# Patient Record
Sex: Female | Born: 1950
Health system: Southern US, Community
[De-identification: ages and names within clinical notes are randomized; demographics above are authoritative.]

## PROBLEM LIST (undated history)

## (undated) DIAGNOSIS — F329 Major depressive disorder, single episode, unspecified: Secondary | ICD-10-CM

## (undated) DIAGNOSIS — F32A Depression, unspecified: Secondary | ICD-10-CM

## (undated) DIAGNOSIS — E079 Disorder of thyroid, unspecified: Secondary | ICD-10-CM

## (undated) HISTORY — DX: Depression, unspecified: F32.A

## (undated) HISTORY — DX: Disorder of thyroid, unspecified: E07.9

## (undated) HISTORY — PX: CARDIAC ELECTROPHYSIOLOGY MAPPING AND ABLATION: SHX1292

## (undated) HISTORY — PX: CARPAL TUNNEL RELEASE: SHX101

## (undated) HISTORY — DX: Major depressive disorder, single episode, unspecified: F32.9

## (undated) HISTORY — PX: ABDOMINAL HYSTERECTOMY: SHX81

---

## 2002-10-07 ENCOUNTER — Other Ambulatory Visit: Admission: RE | Admit: 2002-10-07 | Discharge: 2002-10-07 | Payer: Self-pay | Admitting: *Deleted

## 2003-02-11 ENCOUNTER — Encounter: Payer: Self-pay | Admitting: Family Medicine

## 2003-02-11 ENCOUNTER — Encounter: Admission: RE | Admit: 2003-02-11 | Discharge: 2003-02-11 | Payer: Self-pay | Admitting: Family Medicine

## 2003-02-15 ENCOUNTER — Ambulatory Visit (HOSPITAL_COMMUNITY): Admission: RE | Admit: 2003-02-15 | Discharge: 2003-02-15 | Payer: Self-pay | Admitting: Internal Medicine

## 2003-06-22 ENCOUNTER — Encounter: Admission: RE | Admit: 2003-06-22 | Discharge: 2003-06-22 | Payer: Self-pay | Admitting: Family Medicine

## 2003-06-22 ENCOUNTER — Encounter: Payer: Self-pay | Admitting: Family Medicine

## 2004-03-30 ENCOUNTER — Other Ambulatory Visit: Admission: RE | Admit: 2004-03-30 | Discharge: 2004-03-30 | Payer: Self-pay | Admitting: Family Medicine

## 2004-05-21 ENCOUNTER — Encounter: Admission: RE | Admit: 2004-05-21 | Discharge: 2004-05-21 | Payer: Self-pay | Admitting: Obstetrics and Gynecology

## 2004-08-03 ENCOUNTER — Other Ambulatory Visit: Admission: RE | Admit: 2004-08-03 | Discharge: 2004-08-03 | Payer: Self-pay | Admitting: Obstetrics and Gynecology

## 2004-11-30 ENCOUNTER — Other Ambulatory Visit: Admission: RE | Admit: 2004-11-30 | Discharge: 2004-11-30 | Payer: Self-pay | Admitting: Obstetrics and Gynecology

## 2005-02-26 ENCOUNTER — Other Ambulatory Visit: Admission: RE | Admit: 2005-02-26 | Discharge: 2005-02-26 | Payer: Self-pay | Admitting: Obstetrics and Gynecology

## 2005-04-02 ENCOUNTER — Ambulatory Visit: Payer: Self-pay | Admitting: Family Medicine

## 2005-06-03 ENCOUNTER — Other Ambulatory Visit: Admission: RE | Admit: 2005-06-03 | Discharge: 2005-06-03 | Payer: Self-pay | Admitting: Obstetrics and Gynecology

## 2005-06-18 ENCOUNTER — Ambulatory Visit: Payer: Self-pay | Admitting: Family Medicine

## 2005-06-19 ENCOUNTER — Encounter: Admission: RE | Admit: 2005-06-19 | Discharge: 2005-06-19 | Payer: Self-pay | Admitting: Obstetrics and Gynecology

## 2006-02-11 ENCOUNTER — Other Ambulatory Visit: Admission: RE | Admit: 2006-02-11 | Discharge: 2006-02-11 | Payer: Self-pay | Admitting: Obstetrics and Gynecology

## 2006-06-18 ENCOUNTER — Other Ambulatory Visit: Admission: RE | Admit: 2006-06-18 | Discharge: 2006-06-18 | Payer: Self-pay | Admitting: Obstetrics and Gynecology

## 2006-08-07 ENCOUNTER — Encounter: Admission: RE | Admit: 2006-08-07 | Discharge: 2006-08-07 | Payer: Self-pay | Admitting: Obstetrics and Gynecology

## 2008-05-26 ENCOUNTER — Ambulatory Visit: Payer: Self-pay | Admitting: Family Medicine

## 2008-05-26 DIAGNOSIS — S59919A Unspecified injury of unspecified forearm, initial encounter: Secondary | ICD-10-CM

## 2008-05-26 DIAGNOSIS — F3289 Other specified depressive episodes: Secondary | ICD-10-CM | POA: Insufficient documentation

## 2008-05-26 DIAGNOSIS — E039 Hypothyroidism, unspecified: Secondary | ICD-10-CM

## 2008-05-26 DIAGNOSIS — F438 Other reactions to severe stress: Secondary | ICD-10-CM

## 2008-05-26 DIAGNOSIS — S6990XA Unspecified injury of unspecified wrist, hand and finger(s), initial encounter: Secondary | ICD-10-CM

## 2008-05-26 DIAGNOSIS — F329 Major depressive disorder, single episode, unspecified: Secondary | ICD-10-CM

## 2008-05-26 DIAGNOSIS — S59909A Unspecified injury of unspecified elbow, initial encounter: Secondary | ICD-10-CM

## 2008-05-26 DIAGNOSIS — F4389 Other reactions to severe stress: Secondary | ICD-10-CM | POA: Insufficient documentation

## 2008-05-26 HISTORY — DX: Hypothyroidism, unspecified: E03.9

## 2008-06-08 LAB — CONVERTED CEMR LAB
ALT: 34 units/L (ref 0–35)
AST: 36 units/L (ref 0–37)
Albumin: 4.4 g/dL (ref 3.5–5.2)
Alkaline Phosphatase: 40 units/L (ref 39–117)
BUN: 25 mg/dL — ABNORMAL HIGH (ref 6–23)
Basophils Absolute: 0.1 10*3/uL (ref 0.0–0.1)
Basophils Relative: 0.8 % (ref 0.0–3.0)
Bilirubin, Direct: 0.1 mg/dL (ref 0.0–0.3)
CO2: 29 meq/L (ref 19–32)
Calcium: 9.2 mg/dL (ref 8.4–10.5)
Chloride: 102 meq/L (ref 96–112)
Cholesterol: 232 mg/dL (ref 0–200)
Creatinine, Ser: 1.1 mg/dL (ref 0.4–1.2)
Direct LDL: 166.7 mg/dL
Eosinophils Absolute: 0.1 10*3/uL (ref 0.0–0.7)
Eosinophils Relative: 1.5 % (ref 0.0–5.0)
GFR calc Af Amer: 66 mL/min
GFR calc non Af Amer: 54 mL/min
Glucose, Bld: 94 mg/dL (ref 70–99)
HCT: 41.4 % (ref 36.0–46.0)
HDL: 59.2 mg/dL (ref >39.0)
Hemoglobin: 14.1 g/dL (ref 12.0–15.0)
Lymphocytes Relative: 32.3 % (ref 12.0–46.0)
MCHC: 34.1 g/dL (ref 30.0–36.0)
MCV: 100.5 fL — ABNORMAL HIGH (ref 78.0–100.0)
Monocytes Absolute: 0.6 10*3/uL (ref 0.1–1.0)
Monocytes Relative: 6.8 % (ref 3.0–12.0)
Neutro Abs: 5 10*3/uL (ref 1.4–7.7)
Neutrophils Relative %: 58.6 % (ref 43.0–77.0)
Platelets: 243 10*3/uL (ref 150–400)
Potassium: 4.5 meq/L (ref 3.5–5.1)
RBC: 4.12 M/uL (ref 3.87–5.11)
RDW: 12.4 % (ref 11.5–14.6)
Sodium: 140 meq/L (ref 135–145)
TSH: 43.19 microintl units/mL — ABNORMAL HIGH (ref 0.35–5.50)
Total Bilirubin: 1.1 mg/dL (ref 0.3–1.2)
Total CHOL/HDL Ratio: 3.9
Total Protein: 7.1 g/dL (ref 6.0–8.3)
Triglycerides: 91 mg/dL (ref 0–149)
VLDL: 18 mg/dL (ref 0–40)
WBC: 8.5 10*3/uL (ref 4.5–10.5)

## 2008-06-09 ENCOUNTER — Encounter (INDEPENDENT_AMBULATORY_CARE_PROVIDER_SITE_OTHER): Payer: Self-pay | Admitting: *Deleted

## 2008-11-09 ENCOUNTER — Ambulatory Visit: Payer: Self-pay | Admitting: Family Medicine

## 2008-11-27 LAB — CONVERTED CEMR LAB
ALT: 18 units/L (ref 0–35)
AST: 19 units/L (ref 0–37)
Albumin: 3.8 g/dL (ref 3.5–5.2)
Alkaline Phosphatase: 41 units/L (ref 39–117)
Bilirubin, Direct: 0.1 mg/dL (ref 0.0–0.3)
Cholesterol: 166 mg/dL (ref 0–200)
HDL: 42.2 mg/dL (ref >39.0)
LDL Cholesterol: 111 mg/dL — ABNORMAL HIGH (ref 0–99)
Total Bilirubin: 1.4 mg/dL — ABNORMAL HIGH (ref 0.3–1.2)
Total CHOL/HDL Ratio: 3.9
Total Protein: 6.4 g/dL (ref 6.0–8.3)
Triglycerides: 63 mg/dL (ref 0–149)
VLDL: 13 mg/dL (ref 0–40)

## 2008-11-29 ENCOUNTER — Encounter (INDEPENDENT_AMBULATORY_CARE_PROVIDER_SITE_OTHER): Payer: Self-pay | Admitting: *Deleted

## 2009-01-16 ENCOUNTER — Encounter: Payer: Self-pay | Admitting: Family Medicine

## 2009-01-23 ENCOUNTER — Telehealth (INDEPENDENT_AMBULATORY_CARE_PROVIDER_SITE_OTHER): Payer: Self-pay | Admitting: *Deleted

## 2009-01-26 ENCOUNTER — Ambulatory Visit: Payer: Self-pay | Admitting: Family Medicine

## 2009-02-13 ENCOUNTER — Telehealth (INDEPENDENT_AMBULATORY_CARE_PROVIDER_SITE_OTHER): Payer: Self-pay | Admitting: *Deleted

## 2009-08-07 ENCOUNTER — Encounter (INDEPENDENT_AMBULATORY_CARE_PROVIDER_SITE_OTHER): Payer: Self-pay | Admitting: *Deleted

## 2009-09-04 ENCOUNTER — Telehealth (INDEPENDENT_AMBULATORY_CARE_PROVIDER_SITE_OTHER): Payer: Self-pay | Admitting: *Deleted

## 2009-10-06 ENCOUNTER — Ambulatory Visit: Payer: Self-pay | Admitting: Family Medicine

## 2010-01-05 ENCOUNTER — Ambulatory Visit: Payer: Self-pay | Admitting: Family Medicine

## 2010-01-08 ENCOUNTER — Encounter (INDEPENDENT_AMBULATORY_CARE_PROVIDER_SITE_OTHER): Payer: Self-pay | Admitting: *Deleted

## 2010-02-07 ENCOUNTER — Ambulatory Visit: Payer: Self-pay | Admitting: Family Medicine

## 2010-02-21 ENCOUNTER — Telehealth (INDEPENDENT_AMBULATORY_CARE_PROVIDER_SITE_OTHER): Payer: Self-pay | Admitting: *Deleted

## 2010-09-30 LAB — CONVERTED CEMR LAB: Pap Smear: NORMAL

## 2010-10-02 ENCOUNTER — Encounter (INDEPENDENT_AMBULATORY_CARE_PROVIDER_SITE_OTHER): Payer: Self-pay | Admitting: *Deleted

## 2010-10-02 ENCOUNTER — Telehealth (INDEPENDENT_AMBULATORY_CARE_PROVIDER_SITE_OTHER): Payer: Self-pay | Admitting: *Deleted

## 2010-10-02 NOTE — Progress Notes (Signed)
Summary: refill-lowne  Phone Note Refill Request Message from:  Patient  Refills Requested: Medication #1:  SYNTHROID 125 MCG TABS 1 by mouth once daily**LABS DUE NOW** pt left VM that she is out of her med  and needs refill but is not sure if she needs labs done prior to refill. left message to call  back to verify pharmacy and labs are due 244.9  TSH  Initial call taken by: [facility] CMA,  September 04, 2009 12:08 PM  Follow-up for Phone Call        pt called back and verified pharm, left message. I called pt and left message for her to call back I sent the rx to pharm. She just needs to schedule labs.  Follow-up by: Army Fossa CMA,  September 04, 2009 1:22 PM    Prescriptions: SYNTHROID 125 MCG TABS (LEVOTHYROXINE SODIUM) 1 by mouth once daily**LABS DUE NOW**  #30 x 0   Entered by:   Army Fossa CMA   Authorized by:   Loreen Freud DO   Signed by:   Army Fossa CMA on 09/04/2009   Method used:   Electronically to        [facility]. [phone]* (retail)       [address]       [place], [place]  [address]       Ph: [phone]       Fax: [phone]   RxID:   [id number]

## 2010-10-02 NOTE — Letter (Signed)
Summary: Previsit letter  [facility]  [address] [place], [place] [address]   Phone: [phone]  Fax: [phone]       01/08/2010 MRN: [id number]  [facility] [place] [address] [place], [place]  [address]  Dear Megan Romero,  Welcome to the Gastroenterology Division at [facility].    You are scheduled to see a nurse for your pre-procedure visit on 01-17-10 at 4:00p.m. on the 3rd floor at [facility], [address]  We ask that you try to arrive at our office 15 minutes prior to your appointment time to allow for check-in.  Your nurse visit will consist of discussing your medical and surgical history, your immediate family medical history, and your medications.    Please bring a complete list of all your medications or, if you prefer, bring the medication bottles and we will list them.  We will need to be aware of both prescribed and over the counter drugs.  We will need to know exact dosage information as well.  If you are on blood thinners (Coumadin, Plavix, Aggrenox, Ticlid, etc.) please call our office today/prior to your appointment, as we need to consult with your physician about holding your medication.   Please be prepared to read and sign documents such as consent forms, a financial agreement, and acknowledgement forms.  If necessary, and with your consent, a friend or relative is welcome to sit-in on the nurse visit with you.  Please bring your insurance card so that we may make a copy of it.  If your insurance requires a referral to see a specialist, please bring your referral form from your primary care physician.  No co-pay is required for this nurse visit.     If you cannot keep your appointment, please call [phone] to cancel or reschedule prior to your appointment date.  This allows [place] the opportunity to schedule an appointment for another patient in need of care.    Thank you for choosing [facility] for your medical  needs.  We appreciate the opportunity to care for you.  Please visit [place] at our website  to learn more about our practice.                     Sincerely.                                                                                                                   The Gastroenterology Division

## 2010-10-02 NOTE — Progress Notes (Signed)
Summary: alternate med  Phone Note Call from Patient   Caller: Patient Summary of Call: Pt called stating she was taking Celexa 1/2 dose and everday she was having HA's and she states that it mader her very aggressive. She would like to know if there is an alternate medication she can take. Army Fossa CMA  February 21, 2010 2:33 PM   Follow-up for Phone Call        prozac 20 mg #30 1 by mouth once daily , 2 refills f/u 4-6 weeks Follow-up by: Loreen Freud DO,  February 21, 2010 3:47 PM  Additional Follow-up for Phone Call Additional follow up Details #1::        left message for pt to call back. Need to give her instructions on new med and need to find out which pharmacy pt uses. Army Fossa CMA  February 21, 2010 3:50 PM     Additional Follow-up for Phone Call Additional follow up Details #2::    left message for pt to call back. Army Fossa CMA  February 23, 2010 9:28 AM   Additional Follow-up for Phone Call Additional follow up Details #3:: Details for Additional Follow-up Action Taken: Pt is aware. Army Fossa CMA  February 23, 2010 2:22 PM   New/Updated Medications: PROZAC 20 MG CAPS (FLUOXETINE HCL) 1 by mouth once daily. Follow up in 4-6 weeks. Prescriptions: PROZAC 20 MG CAPS (FLUOXETINE HCL) 1 by mouth once daily. Follow up in 4-6 weeks.  #30 x 2   Entered by:   Army Fossa CMA   Authorized by:   Loreen Freud DO   Signed by:   Army Fossa CMA on 02/23/2010   Method used:   Electronically to        [facility]. [phone]* (retail)       [address]       [place], [place]  [address]       Ph: [phone]       Fax: [phone]   RxID:   [id number]

## 2010-10-02 NOTE — Assessment & Plan Note (Signed)
Summary: FOR CPX AND MENOPAUSE PROBLEM--PH   Vital Signs:  Patient profile:   60 year old female Height:      68 inches Weight:      149 pounds BMI:     22.74 Pulse rate:   82 / minute Pulse rhythm:   regular BP sitting:   106 / 76  (left arm) Cuff size:   regular  Vitals Entered By: Army Fossa CMA (Jan 05, 2010 2:14 PM) CC: Pt here for CPX, no pap. , Depressive symptoms   History of Present Illness: Pt here for cpe. Pt still struggling with anxiety and grief from huband dying 16 days after being married.  There are also a lot of changes at work.    Depressive symptoms      This is a 60 year old woman who presents with Depressive symptoms.  The patient reports depressed mood and insomnia, but denies loss of interest/pleasure, significant weight loss, significant weight gain, hypersomnia, psychomotor agitation, and psychomotor retardation.  The patient also reports fatigue or loss of energy and indecisiveness.  The patient denies feelings of worthlessness, diminished concentration, thoughts of death, thoughts of suicide, suicidal intent, and suicidal plans.  The patient reports the following psychosocial stressors: recent death of a loved one and major life changes.  The patient denies abnormally elevated mood, abnormally irritable mood, decreased need for sleep, increased talkativeness, distractibility, flight of ideas, increased goal-directed activity, and inflated self-esteem/ grandiosity.    Preventive Screening-Counseling & Management  Alcohol-Tobacco     Alcohol drinks/day: 2     Smoking Status: current  Caffeine-Diet-Exercise     Caffeine use/day: 2     Does Patient Exercise: yes  Hep-HIV-STD-Contraception     HIV Risk: no     Dental Visit-last 6 months yes     Dental Care Counseling: not indicated; dental care within six months     Sun Exposure-Excessive: frequently  Safety-Violence-Falls     Seat Belt Use: 100      Drug Use:  no.    Current Medications  (verified): 1)  Aspirin 81 Mg Tbec (Aspirin) .... Take 1 Tab Once Daily 2)  Synthroid 125 Mcg Tabs (Levothyroxine Sodium) .Marland Kitchen.. 1 By Mouth Once Daily 3)  Celexa 20 Mg Tabs (Citalopram Hydrobromide) .Marland Kitchen.. 1 By Mouth Once Daily  Allergies: 1)  ! Pcn  Past History:  Past Medical History: Last updated: 05/26/2008 Hypothyroidism Depression  Past Surgical History: Last updated: 05/26/2008 Hysterectomy Current Problems:  WRIST INJURY (ICD-959.3) * CARDIAC ABLATION  Family History: Last updated: 05/26/2008 none  Social History: Last updated: 05/26/2008 Widow/Widower Current Smoker Drug use-no Regular exercise-yes Alcohol use-yes  Risk Factors: Alcohol Use: 2 (01/05/2010) Caffeine Use: 2 (01/05/2010) Exercise: yes (01/05/2010)  Risk Factors: Smoking Status: current (01/05/2010)  Family History: Reviewed history from 05/26/2008 and no changes required. none  Social History: Reviewed history from 05/26/2008 and no changes required. Widow/Widower Current Smoker Drug use-no Regular exercise-yes Alcohol use-yes Dental Care w/in 6 mos.:  yes  Review of Systems      See HPI General:  Denies chills, fatigue, fever, loss of appetite, malaise, sleep disorder, sweats, weakness, and weight loss. Eyes:  Denies blurring, discharge, double vision, eye irritation, eye pain, halos, itching, light sensitivity, red eye, vision loss-1 eye, and vision loss-both eyes; optho q1y. ENT:  Denies decreased hearing, difficulty swallowing, ear discharge, earache, hoarseness, nasal congestion, nosebleeds, postnasal drainage, ringing in ears, sinus pressure, and sore throat. CV:  Denies bluish discoloration of lips or nails,  chest pain or discomfort, difficulty breathing at night, difficulty breathing while lying down, fainting, fatigue, leg cramps with exertion, lightheadness, near fainting, palpitations, shortness of breath with exertion, swelling of feet, swelling of hands, and weight  gain. Resp:  Denies chest discomfort, chest pain with inspiration, cough, coughing up blood, excessive snoring, hypersomnolence, morning headaches, pleuritic, shortness of breath, sputum productive, and wheezing. GI:  Denies abdominal pain, bloody stools, change in bowel habits, constipation, dark tarry stools, diarrhea, excessive appetite, gas, hemorrhoids, indigestion, loss of appetite, nausea, vomiting, vomiting blood, and yellowish skin color. GU:  Denies abnormal vaginal bleeding, decreased libido, discharge, dysuria, genital sores, hematuria, incontinence, nocturia, urinary frequency, and urinary hesitancy. MS:  Denies joint pain, joint redness, joint swelling, loss of strength, low back pain, mid back pain, muscle aches, muscle , cramps, muscle weakness, stiffness, and thoracic pain. Derm:  Denies changes in color of skin, changes in nail beds, dryness, excessive perspiration, flushing, hair loss, insect bite(s), itching, lesion(s), poor wound healing, and rash. Neuro:  Denies brief paralysis, difficulty with concentration, disturbances in coordination, falling down, headaches, inability to speak, memory loss, numbness, poor balance, seizures, sensation of room spinning, tingling, tremors, visual disturbances, and weakness. Psych:  Complains of depression; denies alternate hallucination ( auditory/visual), anxiety, easily angered, easily tearful, irritability, mental problems, panic attacks, sense of great danger, suicidal thoughts/plans, thoughts of violence, unusual visions or sounds, and thoughts /plans of harming others. Endo:  Denies cold intolerance, excessive hunger, excessive thirst, excessive urination, heat intolerance, polyuria, and weight change. Heme:  Denies abnormal bruising, bleeding, enlarge lymph nodes, fevers, pallor, and skin discoloration. Allergy:  Denies hives or rash, itching eyes, persistent infections, seasonal allergies, and sneezing.  Physical Exam  General:   Well-developed,well-nourished,in no acute distress; alert,appropriate and cooperative throughout examination Head:  Normocephalic and atraumatic without obvious abnormalities. No apparent alopecia or balding. Eyes:  pupils equal, pupils round, pupils reactive to light, and no injection.   Ears:  External ear exam shows no significant lesions or deformities.  Otoscopic examination reveals clear canals, tympanic membranes are intact bilaterally without bulging, retraction, inflammation or discharge. Hearing is grossly normal bilaterally. Nose:  External nasal examination shows no deformity or inflammation. Nasal mucosa are pink and moist without lesions or exudates. Mouth:  Oral mucosa and oropharynx without lesions or exudates.  Teeth in good repair. Neck:  No deformities, masses, or tenderness noted. Chest Wall:  No deformities, masses, or tenderness noted. Breasts:  No mass, nodules, thickening, tenderness, bulging, retraction, inflamation, nipple discharge or skin changes noted.   Lungs:  Normal respiratory effort, chest expands symmetrically. Lungs are clear to auscultation, no crackles or wheezes. Heart:  normal rate and no murmur.   Abdomen:  Bowel sounds positive,abdomen soft and non-tender without masses, organomegaly or hernias noted. Genitalia:  deferred Msk:  normal ROM, no joint tenderness, no joint swelling, no joint warmth, no redness over joints, no joint deformities, no joint instability, and no crepitation.   Pulses:  R posterior tibial normal, R dorsalis pedis normal, R carotid normal, L posterior tibial normal, L dorsalis pedis normal, and L carotid normal.   Extremities:  No clubbing, cyanosis, edema, or deformity noted with normal full range of motion of all joints.   Neurologic:  No cranial nerve deficits noted. Station and gait are normal. Plantar reflexes are down-going bilaterally. DTRs are symmetrical throughout. Sensory, motor and coordinative functions appear intact. Skin:   Intact without suspicious lesions or rashes Cervical Nodes:  No lymphadenopathy noted Psych:  Oriented  X3, normally interactive, good eye contact, not anxious appearing, and not depressed appearing.     Impression & Recommendations:  Problem # 1:  PREVENTIVE HEALTH CARE (ICD-V70.0)  Orders: EKG w/ Interpretation ([address]) Gastroenterology Referral (GI)  Problem # 2:  OTHER ACUTE REACTIONS TO STRESS (ICD-308.3) celexa 20 mg once daily   Problem # 3:  HYPOTHYROIDISM (ICD-244.9)  Her updated medication list for this problem includes:    Synthroid 125 Mcg Tabs (Levothyroxine sodium) .Marland Kitchen... 1 by mouth once daily  Orders: EKG w/ Interpretation ([address])  Labs Reviewed: TSH: 2.26 (10/06/2009)    Chol: 166 (11/09/2008)   HDL: 42.2 (11/09/2008)   LDL: 111 (11/09/2008)   TG: 63 (11/09/2008)  Complete Medication List: 1)  Aspirin 81 Mg Tbec (Aspirin) .... Take 1 tab once daily 2)  Synthroid 125 Mcg Tabs (Levothyroxine sodium) .Marland Kitchen.. 1 by mouth once daily 3)  Celexa 20 Mg Tabs (Citalopram hydrobromide) .Marland Kitchen.. 1 by mouth once daily  Patient Instructions: 1)  Start Celexa 20 mg 1/2 by mouth daily for 8 days then increase to 1 pill daily 2)  Please schedule a follow-up appointment in 1 month---ov and labs Prescriptions: CELEXA 20 MG TABS (CITALOPRAM HYDROBROMIDE) 1 by mouth once daily  #30 x 5   Entered and Authorized by:   Loreen Freud DO   Signed by:   Loreen Freud DO on 01/05/2010   Method used:   Electronically to        [facility]. [phone]* (retail)       [address]       [place], [place]  [address]       Ph: [phone]       Fax: [phone]   RxID:   [id number]    EKG  Procedure date:  01/05/2010  Findings:      Normal sinus rhythm with rate of: 84 bpm   Right axis deviation.     PAP Result Date:  01/14/2007 PAP Result:  normal PAP Next Due:  3 yr      Immunization History:  Influenza Immunization History:    Influenza:   Fluvax 3+ (06/14/2009)  Tetanus/Td Immunization History:    Tetanus/Td:  Historical (03/18/2001)

## 2010-10-10 NOTE — Progress Notes (Signed)
Summary: RX  Phone Note Refill Request Call back at Home Phone [phone] Message from:  Patient on October 02, 2010 1:34 PM  Refills Requested: Medication #1:  SYNTHROID 125 MCG TABS 1 by mouth once daily   Dosage confirmed as above?Dosage Confirmed   Supply Requested: 3 months PLEASE SENT TO MAIL ORDER--[facility] ---WANT TO KNOW IF IT IS TIME TO GET HER THYROID CHECK PLEASE CALL AND LET HER KNOW.  Initial call taken by: Freddy Jaksch,  October 02, 2010 1:35 PM    Prescriptions: SYNTHROID 125 MCG TABS (LEVOTHYROXINE SODIUM) 1 by mouth once daily  #90 Tablet x 0   Entered by:   Almeta Monas CMA (AAMA)   Authorized by:   Loreen Freud DO   Signed by:   Almeta Monas CMA (AAMA) on 10/02/2010   Method used:   Faxed to ...       [facility] (mail-order)             , [place]         Ph: [phone]       Fax: [phone]   RxID:   [id number]

## 2010-10-10 NOTE — Letter (Signed)
Summary: Primary Care Appointment Letter  [facility]  [address] [place], [place] [address]   Phone: [phone]  Fax: [phone]    10/02/2010 MRN: [id number]     [facility] [address] [place], [place]  [address]     Dear Ms. Lovick,   Your Primary Care Physician Loreen Freud DO has indicated that:    ___X____it is time to schedule an appointment for Labs.    _______you missed your appointment on______ and need to call and          reschedule.    _______you need to have lab work done.    _______you need to schedule an appointment discuss lab or test results.    _______you need to call to reschedule your appointment that is                       scheduled on _________.     Please call our office as soon as possible. Our phone number is [phone]. Please press option 1. Our office is open 8a-5p, Monday through Friday.     Thank you,    [place] [facility] Scheduler

## 2010-12-27 ENCOUNTER — Telehealth: Payer: Self-pay | Admitting: Family Medicine

## 2010-12-27 NOTE — Telephone Encounter (Signed)
The only meds she takes Synthroid so TSH is all she needs. CPX due in May      KP

## 2010-12-27 NOTE — Telephone Encounter (Signed)
Patient received letter [phone] requesting that she make a lab appt - patient has lab scheduled [phone] - does she need other labs or just tsh - need lab order with dx

## 2010-12-27 NOTE — Telephone Encounter (Signed)
Pt needs fasting appt for cpx w/ Dr. And will do labs after seeing Dr. Rock Nephew past do for everything.

## 2010-12-28 NOTE — Telephone Encounter (Signed)
Lab on schedule per kim dx 244.9

## 2011-01-07 ENCOUNTER — Other Ambulatory Visit (INDEPENDENT_AMBULATORY_CARE_PROVIDER_SITE_OTHER): Payer: 59

## 2011-01-07 DIAGNOSIS — E039 Hypothyroidism, unspecified: Secondary | ICD-10-CM

## 2011-01-07 LAB — TSH: TSH: 1.11 u[IU]/mL (ref 0.35–5.50)

## 2011-01-10 ENCOUNTER — Telehealth: Payer: Self-pay | Admitting: Family Medicine

## 2011-01-10 MED ORDER — LEVOTHYROXINE SODIUM 125 MCG PO TABS: 125.0000 ug | ORAL_TABLET | Freq: Every day | ORAL | Status: DC

## 2011-01-10 NOTE — Telephone Encounter (Signed)
Faxed.   KP 

## 2011-01-10 NOTE — Telephone Encounter (Signed)
Patient needs new rx for synthroid - [facility] mail order

## 2011-01-18 NOTE — Discharge Summary (Signed)
   Megan Romero, Megan Romero                         ACCOUNT NO.:  [id number]   MEDICAL RECORD NO.:  [id number]                   PATIENT TYPE:  OIB   LOCATION:  2034                                 FACILITY:  [facility]   PHYSICIAN:  Duke Salvia, M.D.               DATE OF BIRTH:  1951/01/30   DATE OF ADMISSION:  02/15/2003  DATE OF DISCHARGE:  02/15/2003                                 DISCHARGE SUMMARY   PRIMARY DIAGNOSIS:  Atrial supraventricular tachycardia.   SECONDARY DIAGNOSIS:  None.   HISTORY OF PRESENT ILLNESS:  This is a 60 year old female with a history of  recent abrupt onset tachycardia associated with modest symptoms, typically  recent duration. The patient was given an event recorder, which demonstrated  supraventricular tachycardia. A 2-D echo was reportedly normal. Normal  stress test.   PAST MEDICAL HISTORY:  Hyperthyroidism status post hysterectomy.   HOSPITAL COURSE:  The patient was admitted and underwent a successful  ablation of her right atrial tachycardia spontaneously and easily induced  atrial flutters, atypical ablations. No precursory pathway. The patient was  observed for eight hours. She did run hypotensive postoperatively and had a  250 cc normal saline bolus with increase of heart rate to the 80s, 82/40  range. She remained in normal sinus rhythm. She was discharged in stable  condition on Effexor 75 mg daily, Synthroid 1.25 mg daily, and Ministat  daily. She was to stop her Cardizem, coated aspirin daily for six weeks.  Antibiotics prophylaxis for the next three months. Tylenol 1-2 tablets every  4-6 hours as needed. No heavy lifting or strenuous activities for four days.  No driving.   Low-fat, low-cholesterol diet. She was to call if she developed a lump or  any drainage in her groin. An appointment was scheduled with Dr. Graciela Husbands for  August 18 at 3:30 p.m.     Chinita Pester, C.R.N.P. [facility]                 Duke Salvia, M.D.    DS/MEDQ  D:   02/15/2003  T:  02/15/2003  Job:  [id number]   cc:   Duke Salvia, M.D.   Learta Codding, M.D.  [address]  [address]  [place]  [place] [address]    cc:   Duke Salvia, M.D.   Learta Codding, M.D.  [address]  [address]  [place]  [place] [address]

## 2011-01-18 NOTE — Op Note (Signed)
NAMEJAZLYNE, Megan Romero                         ACCOUNT NO.:  [id number]   MEDICAL RECORD NO.:  [id number]                   PATIENT TYPE:  OIB   LOCATION:  2034                                 FACILITY:  [facility]   PHYSICIAN:  Duke Salvia, M.D.               DATE OF BIRTH:  02/13/1951   DATE OF PROCEDURE:  02/15/2003  DATE OF DISCHARGE:                                 OPERATIVE REPORT   PREOPERATIVE DIAGNOSIS:  Supraventricular tachycardia.   POSTOPERATIVE DIAGNOSES:  Right atrial tachycardia; atrial flutter-  atypical/atrial fibrillation.   PROCEDURE PERFORMED:  Invasive electrophysiologic study, isoproterenol  infusion, arrhythmia mapping, radio frequency catheter ablation.   DESCRIPTION OF PROCEDURE:  Following obtaining informed consent, the patient  was brought to the electrophysiology laboratory and placed on the  fluoroscopic table in supine position.  After routine prep and drape,  cardiac catheterization was performed with local anesthesia and conscious  sedation.  Noninvasive blood pressure monitoring, transcutaneous oxygen  saturation monitoring, end tidal CO2 monitoring were performed continuously  throughout the procedure.  Following the procedure, the catheter was  removed.  Hemostasis was obtained.  The patient was transferred to the floor  in stable condition.   CATHETERS:  A 5 French quadripolar catheter was inserted via the left  femoral vein to the high right atrium.  A 5 French quadripolar catheter was  inserted via the left femoral vein to the right ventricular apex. A 5 French  quadripolar catheter was inserted via the left femoral vein to the  AV  junction.  A 6 French octapolar catheter was inserted via the right femoral  vein to the coronary sinus.  A 7 French 5 mm deflectable tip catheter was  inserted via the right femoral vein to mapping sites in the posterior septal  space.   TECHNIQUE:  Surface leads I, aVF, and V1 were monitored continuously  throughout the procedure.  Following insertion of the catheters, the  stimulation protocol included incremental atrial pacing, incremental  ventricular pacing, single atrial extrastimuli at a paced cycle length of  600, 500 and 400 msec.  Single ventricular extrastimuli at a paced cycle  length of 600 and 500 msec.  Repeated in the presence of isoproterenol.   RESULTS:  Surface electrocardiogram at triphasic intervals.   INITIAL:   FINAL:  Rhythm sinus initially                  Sinus final  Cycle length is 950 msec initial          524 msec final  Cycle length is 149 msec initial          120 msec final  QRS duration is 120 msec initial    9 msec final  QT interval is 508 msec initial           361 msec final  P-wave duration is 104 msec initial 106 msec final  Bundle branch block is absent             absent on final  Pre-excitation is absent                  absent on final   AH INTERVAL:  AH interval is 81 msec initial          62 msec final  HA interval is 47 msec initial            60 msec final  His bundle duration is 14 msec initial    17 msec final   AV NODAL FUNCTION:  AV Wenckebach was less than 500 msec in the presence of  isoproterenol and was about 500 msec in the absence of it.  VA Wenckebach  was also 500 msec in the absence of isoproterenol and in the presence of  isoproterenol was 380 msec.  AV nodal conduction was continuous pre and  postablation.   ACCESSORY PATHWAY FUNCTION:  No evidence of an accessory pathway was  identified.   ARRHYTHMIAS INDUCED:  1. Atrial tachycardia was reproducibly inducible to ventricular programmed     stimulation.  This was not seen with atrial stimulation.  Double     ventricular extrastimuli at 450:250:300-290 in the presence of     isoproterenol reproducibly induced with tachycardia with earliest atrial     activation initially at the os of the coronary sinus.  See below.  2. Atypical atrial flutters were also induced with left  and right atrial     discordance and lateral and septal right atrial discordance with atrial     fibrillation noted on the right side and atrial flutter notably passive     activation noted on the left side.  3. These were induced in the presence and in the absence of isoproterenol     with single atrial extrastimuli as well as atrial burst pacing at     .   CHARACTERISTICS OF ATRIAL TACHYCARDIA:  1. Earliest onset was in the low right atrium at the distal ramification of     the crista terminalis just posterior to the os of this coronary sinus.  2. It was initiated with ventricular pacing.  3. When it would terminate, it would terminate with an AV.  4. One episode of AV block was noted during tachycardia.   RADIOFREQUENCY ENERGY:  A total of 122 seconds of radio frequency energy was  applied in four applications.  The initial application accelerated the  tachycardia and then terminated it.  The three applications were placed in a  consolidation rosette around the site of the initial burn.   FLUOROSCOPY TIME:  A total of 15 minutes of fluoroscopy time was utilized at  15 frames per second.   IMPRESSION:  1. Normal sinus function.  2. Abnormal atrial function manifested by:     A. A low right atrial tachycardia  at the terminal ramification of the        crista terminalis.  This was successfully ablated.     B. Atrial fibrillation and atrial flutter with right side appearing to        dry up the left side with right atrial/left atrial discordance.     C. Other nonsustained atrial tachycardias were seen with earliest onset        in the left atrium.  3. Normal atrioventricular (AV) nodal function.  4. No accessory pathway.  5. Normal ventricular response to programmed stimulation.  6. Normal  His Purkinje system function.   SUMMARY:  In conclusion, the results of electrophysiologic testing identified a dominant right atrial tachycardia that like the patient's  clinical  tachycardia was initiated with ventricular premature beats.  This  was mapped to the low right atrium just posterior to the coronary sinus os  and was successfully ablated at this site.   There were other atrial arrhythmias as noted above suggesting an underlying  atriopathy.  Long term benefits will need to be awaited as relates to these  other arrhythmias.   RECOMMENDATIONS:  Will plan to observe the patient overnight.  Will  discontinue the patient's Cardizem as she is not taking it anyway and we  will see how she does.                                               Duke Salvia, M.D.    SCK/MEDQ  D:  02/15/2003  T:  02/15/2003  Job:  [id number]   cc:   Angelena Sole, M.D. [facility]

## 2011-05-14 ENCOUNTER — Other Ambulatory Visit: Payer: Self-pay | Admitting: Family Medicine

## 2011-05-14 MED ORDER — LEVOTHYROXINE SODIUM 125 MCG PO TABS: 125.0000 ug | ORAL_TABLET | Freq: Every day | ORAL | Status: DC

## 2011-05-14 NOTE — Telephone Encounter (Signed)
Faxed.   KP 

## 2011-05-17 ENCOUNTER — Encounter: Payer: 59 | Admitting: Family Medicine

## 2011-05-27 ENCOUNTER — Encounter: Payer: Self-pay | Admitting: Family Medicine

## 2011-05-27 ENCOUNTER — Ambulatory Visit (INDEPENDENT_AMBULATORY_CARE_PROVIDER_SITE_OTHER): Payer: 59 | Admitting: Family Medicine

## 2011-05-27 VITALS — BP 118/70 | HR 81 | Temp 98.8°F | Ht 68.0 in | Wt 158.0 lb

## 2011-05-27 DIAGNOSIS — Z23 Encounter for immunization: Secondary | ICD-10-CM

## 2011-05-27 DIAGNOSIS — Z0000 Encounter for general adult medical examination without abnormal findings: Secondary | ICD-10-CM

## 2011-05-27 DIAGNOSIS — Z2911 Encounter for prophylactic immunotherapy for respiratory syncytial virus (RSV): Secondary | ICD-10-CM

## 2011-05-27 DIAGNOSIS — E039 Hypothyroidism, unspecified: Secondary | ICD-10-CM

## 2011-05-27 NOTE — Progress Notes (Signed)
Subjective:     Megan Romero is a 60 y.o. female and is here for a comprehensive physical exam. The patient reports no problems.  History   Social History  . Marital Status: Widowed    Spouse Name: N/A    Number of Children: N/A  . Years of Education: N/A   Occupational History  . Not on file.   Social History Main Topics  . Smoking status: Current Everyday Smoker    Types: Cigarettes  . Smokeless tobacco: Never Used  . Alcohol Use: Yes  . Drug Use: No  . Sexually Active: Not Currently -- Female partner(s)   Other Topics Concern  . Not on file   Social History Narrative  . No narrative on file   Health Maintenance  Topic Date Due  . Pap Smear  08/30/1968  . Colonoscopy  08/30/2000  . Mammogram  08/07/2008  . Zostavax  08/30/2010  . Tetanus/tdap  03/19/2011  . Influenza Vaccine  06/03/2011    The following portions of the patient's history were reviewed and updated as appropriate: allergies, current medications, past family history, past medical history, past social history, past surgical history and problem list.  Review of Systems Review of Systems  Constitutional: Negative for activity change, appetite change and fatigue.  HENT: Negative for hearing loss, congestion, tinnitus and ear discharge.  dentist q27m Eyes: Negative for visual disturbance (see optho q1y -- vision corrected to 20/20 with glasses).  Respiratory: Negative for cough, chest tightness and shortness of breath.   Cardiovascular: Negative for chest pain, palpitations and leg swelling.  Gastrointestinal: Negative for abdominal pain, diarrhea, constipation and abdominal distention.  Genitourinary: Negative for urgency, frequency, decreased urine volume and difficulty urinating.  Musculoskeletal: Negative for back pain, arthralgias and gait problem.  Skin: Negative for color change, pallor and rash.  Neurological: Negative for dizziness, light-headedness, numbness and headaches.  Hematological:  Negative for adenopathy. Does not bruise/bleed easily.  Psychiatric/Behavioral: Negative for suicidal ideas, confusion, sleep disturbance, self-injury, dysphoric mood, decreased concentration and agitation.  Derm-- Dr Danella Deis    Objective:     BP 118/70  Pulse 81  Temp(Src) 98.8 F (37.1 C) (Oral)  Ht 5\' 8"  (1.727 m)  Wt 158 lb (71.668 kg)  BMI 24.02 kg/m2  SpO2 97%  General Appearance:    Alert, cooperative, no distress, appears stated age  Head:    Normocephalic, without obvious abnormality, atraumatic  Eyes:    PERRL, conjunctiva/corneas clear, EOM's intact, fundi    benign, both eyes  Ears:    Normal TM's and external ear canals, both ears  Nose:   Nares normal, septum midline, mucosa normal, no drainage    or sinus tenderness  Throat:   Lips, mucosa, and tongue normal; teeth and gums normal  Neck:   Supple, symmetrical, trachea midline, no adenopathy;    thyroid:  no enlargement/tenderness/nodules; no carotid   bruit or JVD  Back:     Symmetric, no curvature, ROM normal, no CVA tenderness  Lungs:     Clear to auscultation bilaterally, respirations unlabored  Chest Wall:    No tenderness or deformity   Heart:    Regular rate and rhythm, S1 and S2 normal, no murmur, rub   or gallop  Breast Exam:    No tenderness, masses, or nipple abnormality  Abdomen:     Soft, non-tender, bowel sounds active all four quadrants,    no masses, no organomegaly  Genitalia:    S/p tah  Rectal:  S/p tah  Extremities:   Extremities normal, atraumatic, no cyanosis or edema  Pulses:   2+ and symmetric all extremities  Skin:   Skin color, texture, turgor normal, no rashes or lesions  Lymph nodes:   Cervical, supraclavicular, and axillary nodes normal  Neurologic:   CNII-XII intact, normal strength, sensation and reflexes    throughout     Assessment:    Healthy female exam.  hypothyroid     Plan:    ghm utd Check labs See After Visit Summary for Counseling Recommendations

## 2011-05-27 NOTE — Patient Instructions (Signed)

## 2011-05-29 ENCOUNTER — Other Ambulatory Visit: Payer: Self-pay | Admitting: Family Medicine

## 2011-05-30 ENCOUNTER — Other Ambulatory Visit (INDEPENDENT_AMBULATORY_CARE_PROVIDER_SITE_OTHER): Payer: 59

## 2011-05-30 ENCOUNTER — Ambulatory Visit (INDEPENDENT_AMBULATORY_CARE_PROVIDER_SITE_OTHER): Payer: 59 | Admitting: *Deleted

## 2011-05-30 VITALS — Temp 98.7°F

## 2011-05-30 DIAGNOSIS — E039 Hypothyroidism, unspecified: Secondary | ICD-10-CM

## 2011-05-30 DIAGNOSIS — Z0000 Encounter for general adult medical examination without abnormal findings: Secondary | ICD-10-CM

## 2011-05-30 DIAGNOSIS — Z23 Encounter for immunization: Secondary | ICD-10-CM

## 2011-05-30 LAB — CBC WITH DIFFERENTIAL/PLATELET
Basophils Absolute: 0 10*3/uL (ref 0.0–0.1)
Basophils Relative: 0.5 % (ref 0.0–3.0)
Eosinophils Absolute: 0.2 10*3/uL (ref 0.0–0.7)
Lymphocytes Relative: 36.8 % (ref 12.0–46.0)
MCHC: 33.3 g/dL (ref 30.0–36.0)
Monocytes Relative: 6.4 % (ref 3.0–12.0)
Neutrophils Relative %: 54.1 % (ref 43.0–77.0)
RBC: 4.11 Mil/uL (ref 3.87–5.11)

## 2011-05-30 NOTE — Progress Notes (Signed)
Labs only

## 2011-05-31 LAB — BASIC METABOLIC PANEL
CO2: 26 mEq/L (ref 19–32)
Calcium: 8.7 mg/dL (ref 8.4–10.5)
Creatinine, Ser: 0.8 mg/dL (ref 0.4–1.2)
GFR: 76.47 mL/min (ref >60.00)

## 2011-05-31 LAB — HEPATIC FUNCTION PANEL
ALT: 15 U/L (ref 0–35)
AST: 22 U/L (ref 0–37)
Albumin: 4.3 g/dL (ref 3.5–5.2)
Alkaline Phosphatase: 40 U/L (ref 39–117)
Total Protein: 6.9 g/dL (ref 6.0–8.3)

## 2011-05-31 LAB — LIPID PANEL
Cholesterol: 166 mg/dL (ref 0–200)
HDL: 44.2 mg/dL (ref >39.00)
Triglycerides: 99 mg/dL (ref 0.0–149.0)

## 2011-06-16 ENCOUNTER — Other Ambulatory Visit: Payer: Self-pay | Admitting: Family Medicine

## 2011-08-07 ENCOUNTER — Other Ambulatory Visit: Payer: Self-pay | Admitting: Family Medicine

## 2011-10-16 ENCOUNTER — Telehealth: Payer: Self-pay | Admitting: Family Medicine

## 2011-10-16 ENCOUNTER — Ambulatory Visit (INDEPENDENT_AMBULATORY_CARE_PROVIDER_SITE_OTHER): Payer: PRIVATE HEALTH INSURANCE | Admitting: Family Medicine

## 2011-10-16 ENCOUNTER — Encounter: Payer: Self-pay | Admitting: Family Medicine

## 2011-10-16 DIAGNOSIS — J111 Influenza due to unidentified influenza virus with other respiratory manifestations: Secondary | ICD-10-CM

## 2011-10-16 DIAGNOSIS — J329 Chronic sinusitis, unspecified: Secondary | ICD-10-CM

## 2011-10-16 DIAGNOSIS — J40 Bronchitis, not specified as acute or chronic: Secondary | ICD-10-CM

## 2011-10-16 MED ORDER — CLARITHROMYCIN ER 500 MG PO TB24: 1000.0000 mg | ORAL_TABLET | Freq: Every day | ORAL | Status: AC

## 2011-10-16 MED ORDER — OSELTAMIVIR PHOSPHATE 75 MG PO CAPS: ORAL_CAPSULE | ORAL | Status: DC

## 2011-10-16 NOTE — Patient Instructions (Signed)
Influenza Facts Flu (influenza) is a contagious respiratory illness caused by the influenza viruses. It can cause mild to severe illness. While most healthy people recover from the flu without specific treatment and without complications, older people, young children, and people with certain health conditions are at higher risk for serious complications from the flu, including death. CAUSES   The flu virus is spread from person to person by respiratory droplets from coughing and sneezing.   A person can also become infected by touching an object or surface with a virus on it and then touching their mouth, eye or nose.   Adults may be able to infect others from 1 day before symptoms occur and up to 7 days after getting sick. So it is possible to give someone the flu even before you know you are sick and continue to infect others while you are sick.  SYMPTOMS   Fever (usually high).   Headache.   Tiredness (can be extreme).   Cough.   Sore throat.   Runny or stuffy nose.   Body aches.   Diarrhea and vomiting may also occur, particularly in children.   These symptoms are referred to as "flu-like symptoms". A lot of different illnesses, including the common cold, can have similar symptoms.  DIAGNOSIS   There are tests that can determine if you have the flu as long you are tested within the first 2 or 3 days of illness.   A doctor's exam and additional tests may be needed to identify if you have a disease that is a complicating the flu.  RISKS AND COMPLICATIONS  Some of the complications caused by the flu include:  Bacterial pneumonia or progressive pneumonia caused by the flu virus.   Loss of body fluids (dehydration).   Worsening of chronic medical conditions, such as heart failure, asthma, or diabetes.   Sinus problems and ear infections.  HOME CARE INSTRUCTIONS   Seek medical care early on.   If you are at high risk from complications of the flu, consult your health-care  provider as soon as you develop flu-like symptoms. Those at high risk for complications include:   People 65 years or older.   People with chronic medical conditions, including diabetes.   Pregnant women.   Young children.   Your caregiver may recommend use of an antiviral medication to help treat the flu.   If you get the flu, get plenty of rest, drink a lot of liquids, and avoid using alcohol and tobacco.   You can take over-the-counter medications to relieve the symptoms of the flu if your caregiver approves. (Never give aspirin to children or teenagers who have flu-like symptoms, particularly fever).  PREVENTION  The single best way to prevent the flu is to get a flu vaccine each fall. Other measures that can help protect against the flu are:  Antiviral Medications   A number of antiviral drugs are approved for use in preventing the flu. These are prescription medications, and a doctor should be consulted before they are used.   Habits for Good Health   Cover your nose and mouth with a tissue when you cough or sneeze, throw the tissue away after you use it.   Wash your hands often with soap and water, especially after you cough or sneeze. If you are not near water, use an alcohol-based hand cleaner.   Avoid people who are sick.   If you get the flu, stay home from work or school. Avoid contact with   other people so that you do not make them sick, too.   Try not to touch your eyes, nose, or mouth as germs ore often spread this way.  IN CHILDREN, EMERGENCY WARNING SIGNS THAT NEED URGENT MEDICAL ATTENTION:  Fast breathing or trouble breathing.   Bluish skin color.   Not drinking enough fluids.   Not waking up or not interacting.   Being so irritable that the child does not want to be held.   Flu-like symptoms improve but then return with fever and worse cough.   Fever with a rash.  IN ADULTS, EMERGENCY WARNING SIGNS THAT NEED URGENT MEDICAL ATTENTION:  Difficulty  breathing or shortness of breath.   Pain or pressure in the chest or abdomen.   Sudden dizziness.   Confusion.   Severe or persistent vomiting.  SEEK IMMEDIATE MEDICAL CARE IF:  You or someone you know is experiencing any of the symptoms above. When you arrive at the emergency center,report that you think you have the flu. You may be asked to wear a mask and/or sit in a secluded area to protect others from getting sick. MAKE SURE YOU:   Understand these instructions.   Monitor your condition.   Seek medical care if you are getting worse, or not improving.  Document Released: 08/22/2003 Document Revised: 05/01/2011 Document Reviewed: 05/18/2009 [employer] Patient Information 2012 [employer]. 

## 2011-10-16 NOTE — Progress Notes (Signed)
  Subjective:     Megan Romero is a 61 y.o. female who presents for evaluation of symptoms of a URI. Symptoms include congestion, facial pain, fever 100.6, nasal congestion, post nasal drip, productive cough with  green colored sputum, purulent nasal discharge and sinus pressure. Onset of symptoms was 2 days ago, and has been gradually worsening since that time. Treatment to date: tylenol and afrin.  The following portions of the patient's history were reviewed and updated as appropriate: allergies, current medications, past family history, past medical history, past social history, past surgical history and problem list.  Review of Systems Pertinent items are noted in HPI.   Objective:    BP 108/72  Pulse 97  Temp(Src) 98.7 F (37.1 C) (Oral)  Wt 157 lb 3.2 oz (71.305 kg)  SpO2 94% General appearance: alert, cooperative, appears stated age and mild distress Ears: normal TM's and external ear canals both ears Nose: green discharge, mild congestion, sinus tenderness bilateral Throat: lips, mucosa, and tongue normal; teeth and gums normal Neck: mild anterior cervical adenopathy, supple, symmetrical, trachea midline and thyroid not enlarged, symmetric, no tenderness/mass/nodules Lungs: rhonchi RLL Heart: S1, S2 normal   Assessment:    flu with broncitis/sinusitis  Plan:  tamiflu biaxin qnasl Rest,  Drink plenty of fluids rto 3-4 days or sooner if not doing better  Suggested symptomatic OTC remedies. Nasal saline spray for congestion. biaxinxl per orders. Nasal steroids per orders. Follow up as needed. Call in a few days if symptoms aren't resolving.

## 2011-10-16 NOTE — Telephone Encounter (Signed)
msg left to call the office     KP 

## 2011-10-16 NOTE — Telephone Encounter (Signed)
She can try chlortrimeton 8mg  otc ( comes in 4mg  tabs) Saline rinse Afrin at night mucinex for cough Ov if needed

## 2011-10-16 NOTE — Telephone Encounter (Signed)
Reason for Call: CB#: [phone]; Call regarding cough/Congestion; onset ~ 2 weeks, seen 10/05/11 at [facility] for strep and flu, not treated, new onset 10/14/11 of sinus drainage, tired, aching, low grade fever, gren nasal drainage. Symptoms reviewed URI Guideline, with continued symptoms, appt offered 10/16/11 pt declines, states she will call as needed. Thanks!

## 2011-10-16 NOTE — Telephone Encounter (Signed)
FYI--- apt was offered and the patient declined   KP

## 2011-10-22 NOTE — Telephone Encounter (Signed)
Spoke with patient and she stated she was still congested, he has been advised to try the Afrin and saline rinse and she agreed to do so. She will call if no relief.    KP

## 2012-02-11 ENCOUNTER — Other Ambulatory Visit: Payer: Self-pay | Admitting: Family Medicine

## 2012-03-24 ENCOUNTER — Other Ambulatory Visit: Payer: Self-pay | Admitting: *Deleted

## 2012-03-24 MED ORDER — LEVOTHYROXINE SODIUM 125 MCG PO TABS: ORAL_TABLET | ORAL | Status: DC

## 2012-03-24 NOTE — Telephone Encounter (Signed)
Rx sent 

## 2012-03-25 ENCOUNTER — Other Ambulatory Visit: Payer: Self-pay | Admitting: Family Medicine

## 2012-08-21 ENCOUNTER — Other Ambulatory Visit: Payer: Self-pay | Admitting: *Deleted

## 2012-08-21 MED ORDER — LEVOTHYROXINE SODIUM 125 MCG PO TABS: ORAL_TABLET | ORAL | Status: DC

## 2012-08-21 NOTE — Telephone Encounter (Signed)
Rx sent 

## 2012-10-17 ENCOUNTER — Other Ambulatory Visit: Payer: Self-pay

## 2013-01-11 ENCOUNTER — Telehealth: Payer: Self-pay | Admitting: *Deleted

## 2013-01-11 NOTE — Telephone Encounter (Signed)
Pt needs ov with labs--- then refill

## 2013-01-11 NOTE — Telephone Encounter (Signed)
Last OV 10-16-11, last filled 08-21-12 #30 2 no pending OV

## 2013-01-12 NOTE — Telephone Encounter (Signed)
Apt scheduled Thursday 01/14/13 @ 4:15.    KP

## 2013-01-14 ENCOUNTER — Ambulatory Visit: Payer: PRIVATE HEALTH INSURANCE | Admitting: Family Medicine

## 2013-01-19 ENCOUNTER — Ambulatory Visit (INDEPENDENT_AMBULATORY_CARE_PROVIDER_SITE_OTHER): Payer: Self-pay | Admitting: Family Medicine

## 2013-01-19 ENCOUNTER — Encounter: Payer: Self-pay | Admitting: Family Medicine

## 2013-01-19 VITALS — BP 100/60 | HR 71 | Temp 98.5°F | Wt 149.2 lb

## 2013-01-19 DIAGNOSIS — E039 Hypothyroidism, unspecified: Secondary | ICD-10-CM

## 2013-01-19 LAB — T3, FREE: T3, Free: 1.7 pg/mL — ABNORMAL LOW (ref 2.3–4.2)

## 2013-01-19 NOTE — Progress Notes (Signed)
  Subjective:    Patient ID: Megan Romero, female    DOB: [birth year], 62 y.o.   MRN: [id number]  HPI Pt here f/u thyroid.  No complaints.     Review of Systems  Constitutional: Positive for activity change. Negative for fever, chills, diaphoresis, appetite change, fatigue and unexpected weight change.  pt has lost weight with weight watchers      Objective:   Physical Exam BP 100/60  Pulse 71  Temp(Src) 98.5 F (36.9 C) (Oral)  Wt 149 lb 3.2 oz (67.677 kg)  BMI 22.69 kg/m2  SpO2 98% General appearance: alert, cooperative, appears stated age and no distress Neck: no adenopathy, supple, symmetrical, trachea midline and thyroid not enlarged, symmetric, no tenderness/mass/nodules Lungs: clear to auscultation bilaterally Heart: S1, S2 normal       Assessment & Plan:

## 2013-01-19 NOTE — Assessment & Plan Note (Signed)
con't meds  Check labs 

## 2013-01-19 NOTE — Patient Instructions (Addendum)

## 2013-02-08 ENCOUNTER — Other Ambulatory Visit: Payer: Self-pay | Admitting: Family Medicine

## 2013-07-08 ENCOUNTER — Other Ambulatory Visit: Payer: Self-pay

## 2013-09-03 ENCOUNTER — Telehealth: Payer: Self-pay | Admitting: Family Medicine

## 2013-09-03 MED ORDER — PNEUMOCOCCAL 13-VAL CONJ VACC IM SUSP: 0.5000 mL | Freq: Once | INTRAMUSCULAR | Status: DC

## 2013-09-03 NOTE — Telephone Encounter (Signed)
Msg left to call the office     KP 

## 2013-09-03 NOTE — Telephone Encounter (Signed)
prevnar 13 is the one recommended but she should double check with her insurance

## 2013-09-03 NOTE — Telephone Encounter (Signed)
Patient called and stated that she is self-pay and will be taking the prescription to a pharmacy where it is much cheaper than getting it here at the office.

## 2013-09-03 NOTE — Telephone Encounter (Signed)
ok 

## 2013-09-03 NOTE — Telephone Encounter (Signed)
Rx printed and mailed.       KP

## 2013-09-03 NOTE — Telephone Encounter (Signed)
Please advise      KP 

## 2013-09-03 NOTE — Telephone Encounter (Signed)
Which vaccine?      KP

## 2013-09-03 NOTE — Telephone Encounter (Signed)
Patient is calling to request a prescription for a pneumonia injection. She would like this mailed to her address on file. Please advise.

## 2015-08-24 ENCOUNTER — Encounter: Payer: Self-pay | Admitting: Cardiovascular Disease

## 2015-10-09 ENCOUNTER — Other Ambulatory Visit (INDEPENDENT_AMBULATORY_CARE_PROVIDER_SITE_OTHER): Payer: PPO

## 2015-10-09 ENCOUNTER — Ambulatory Visit (INDEPENDENT_AMBULATORY_CARE_PROVIDER_SITE_OTHER): Payer: PPO | Admitting: Internal Medicine

## 2015-10-09 ENCOUNTER — Encounter: Payer: Self-pay | Admitting: Internal Medicine

## 2015-10-09 VITALS — BP 100/58 | HR 81 | Temp 98.4°F | Resp 12 | Ht 67.5 in | Wt 142.0 lb

## 2015-10-09 DIAGNOSIS — E039 Hypothyroidism, unspecified: Secondary | ICD-10-CM

## 2015-10-09 DIAGNOSIS — Z0000 Encounter for general adult medical examination without abnormal findings: Secondary | ICD-10-CM

## 2015-10-09 LAB — COMPREHENSIVE METABOLIC PANEL
ALBUMIN: 4.4 g/dL (ref 3.5–5.2)
ALT: 15 U/L (ref 0–35)
AST: 17 U/L (ref 0–37)
Alkaline Phosphatase: 43 U/L (ref 39–117)
BUN: 25 mg/dL — ABNORMAL HIGH (ref 6–23)
CALCIUM: 9.7 mg/dL (ref 8.4–10.5)
CHLORIDE: 105 meq/L (ref 96–112)
CO2: 28 mEq/L (ref 19–32)
CREATININE: 0.9 mg/dL (ref 0.40–1.20)
GFR: 66.77 mL/min (ref >60.00)
Glucose, Bld: 95 mg/dL (ref 70–99)
POTASSIUM: 4.6 meq/L (ref 3.5–5.1)
Sodium: 140 mEq/L (ref 135–145)
Total Bilirubin: 0.7 mg/dL (ref 0.2–1.2)
Total Protein: 6.9 g/dL (ref 6.0–8.3)

## 2015-10-09 LAB — LIPID PANEL
CHOLESTEROL: 177 mg/dL (ref 0–200)
HDL: 48 mg/dL (ref >39.00)
LDL CALC: 98 mg/dL (ref 0–99)
NonHDL: 128.51
TRIGLYCERIDES: 152 mg/dL — AB (ref 0.0–149.0)
Total CHOL/HDL Ratio: 4
VLDL: 30.4 mg/dL (ref 0.0–40.0)

## 2015-10-09 LAB — TSH: TSH: 2.82 u[IU]/mL (ref 0.35–4.50)

## 2015-10-09 LAB — T4, FREE: Free T4: 1.04 ng/dL (ref 0.60–1.60)

## 2015-10-09 NOTE — Progress Notes (Signed)
   Subjective:    Patient ID: Megan Romero, female    DOB: December 16, 1950, 65 y.o.   MRN: [id number]  HPI Here for new to medicare wellness, no new complaints. Please see A/P for status and treatment of chronic medical problems.   Diet: heart healthy Physical activity: active Depression/mood screen: negative Hearing: intact to whispered voice Visual acuity: grossly normal with contacts, performs annual eye exam  ADLs: capable Fall risk: none Home safety: good Cognitive evaluation: intact to orientation, naming, recall and repetition EOL planning: adv directives discussed  I have personally reviewed and have noted 1. The patient's medical and social history - reviewed today no changes 2. Their use of alcohol, tobacco or illicit drugs 3. Their current medications and supplements 4. The patient's functional ability including ADL's, fall risks, home safety risks and hearing or visual impairment. 5. Diet and physical activities 6. Evidence for depression or mood disorders 7. Care team reviewed and updated (available in snapshot)  Review of Systems  Constitutional: Negative for fever, chills, activity change, appetite change, fatigue and unexpected weight change.  HENT: Negative.   Eyes: Negative.   Respiratory: Negative for cough, chest tightness, shortness of breath and wheezing.   Cardiovascular: Negative for chest pain, palpitations and leg swelling.  Gastrointestinal: Negative for nausea, abdominal pain, diarrhea, constipation and abdominal distention.  Musculoskeletal: Negative.   Skin: Negative.   Neurological: Negative for dizziness, syncope, weakness, light-headedness and headaches.  Psychiatric/Behavioral: Negative.       Objective:   Physical Exam  Constitutional: She is oriented to person, place, and time. She appears well-developed and well-nourished.  HENT:  Head: Normocephalic and atraumatic.  Eyes: EOM are normal.  Neck: Normal range of motion.    Cardiovascular: Normal rate and regular rhythm.   Pulmonary/Chest: Effort normal and breath sounds normal. No respiratory distress. She has no wheezes. She has no rales.  Abdominal: Soft. Bowel sounds are normal. She exhibits no distension. There is no tenderness. There is no rebound.  Musculoskeletal: She exhibits no edema.  Neurological: She is alert and oriented to person, place, and time. Coordination normal.  Skin: Skin is warm and dry.  Psychiatric: She has a normal mood and affect.   Filed Vitals:   10/09/15 1116  BP: 100/58  Pulse: 81  Temp: 98.4 F (36.9 C)  TempSrc: Oral  Resp: 12  Height: 5' 7.5" (1.715 m)  Weight: 142 lb (64.411 kg)  SpO2: 96%      Assessment & Plan:

## 2015-10-09 NOTE — Patient Instructions (Signed)
We will check the blood work today and call you back about the results.   We will see you back next year unless you are having any problems.   Keep up the good work with stopping smoking.   Health Maintenance, Female Adopting a healthy lifestyle and getting preventive care can go a long way to promote health and wellness. Talk with your health care provider about what schedule of regular examinations is right for you. This is a good chance for you to check in with your provider about disease prevention and staying healthy. In between checkups, there are plenty of things you can do on your own. Experts have done a lot of research about which lifestyle changes and preventive measures are most likely to keep you healthy. Ask your health care provider for more information. WEIGHT AND DIET  Eat a healthy diet  Be sure to include plenty of vegetables, fruits, low-fat dairy products, and lean protein.  Do not eat a lot of foods high in solid fats, added sugars, or salt.  Get regular exercise. This is one of the most important things you can do for your health.  Most adults should exercise for at least 150 minutes each week. The exercise should increase your heart rate and make you sweat (moderate-intensity exercise).  Most adults should also do strengthening exercises at least twice a week. This is in addition to the moderate-intensity exercise.  Maintain a healthy weight  Body mass index (BMI) is a measurement that can be used to identify possible weight problems. It estimates body fat based on height and weight. Your health care provider can help determine your BMI and help you achieve or maintain a healthy weight.  For females 66 years of age and older:   A BMI below 18.5 is considered underweight.  A BMI of 18.5 to 24.9 is normal.  A BMI of 25 to 29.9 is considered overweight.  A BMI of 30 and above is considered obese.  Watch levels of cholesterol and blood lipids  You should  start having your blood tested for lipids and cholesterol at 66 years of age, then have this test every 5 years.  You may need to have your cholesterol levels checked more often if:  Your lipid or cholesterol levels are high.  You are older than 66 years of age.  You are at high risk for heart disease.  CANCER SCREENING   Lung Cancer  Lung cancer screening is recommended for adults 59-96 years old who are at high risk for lung cancer because of a history of smoking.  A yearly low-dose CT scan of the lungs is recommended for people who:  Currently smoke.  Have quit within the past 15 years.  Have at least a 30-pack-year history of smoking. A pack year is smoking an average of one pack of cigarettes a day for 1 year.  Yearly screening should continue until it has been 15 years since you quit.  Yearly screening should stop if you develop a health problem that would prevent you from having lung cancer treatment.  Breast Cancer  Practice breast self-awareness. This means understanding how your breasts normally appear and feel.  It also means doing regular breast self-exams. Let your health care provider know about any changes, no matter how small.  If you are in your 20s or 30s, you should have a clinical breast exam (CBE) by a health care provider every 1-3 years as part of a regular health exam.  If  you are 40 or older, have a CBE every year. Also consider having a breast X-ray (mammogram) every year.  If you have a family history of breast cancer, talk to your health care provider about genetic screening.  If you are at high risk for breast cancer, talk to your health care provider about having an MRI and a mammogram every year.  Breast cancer gene (BRCA) assessment is recommended for women who have family members with BRCA-related cancers. BRCA-related cancers include:  Breast.  Ovarian.  Tubal.  Peritoneal cancers.  Results of the assessment will determine the  need for genetic counseling and BRCA1 and BRCA2 testing. Cervical Cancer Your health care provider may recommend that you be screened regularly for cancer of the pelvic organs (ovaries, uterus, and vagina). This screening involves a pelvic examination, including checking for microscopic changes to the surface of your cervix (Pap test). You may be encouraged to have this screening done every 3 years, beginning at age 1.  For women ages 54-65, health care providers may recommend pelvic exams and Pap testing every 3 years, or they may recommend the Pap and pelvic exam, combined with testing for human papilloma virus (HPV), every 5 years. Some types of HPV increase your risk of cervical cancer. Testing for HPV may also be done on women of any age with unclear Pap test results.  Other health care providers may not recommend any screening for nonpregnant women who are considered low risk for pelvic cancer and who do not have symptoms. Ask your health care provider if a screening pelvic exam is right for you.  If you have had past treatment for cervical cancer or a condition that could lead to cancer, you need Pap tests and screening for cancer for at least 20 years after your treatment. If Pap tests have been discontinued, your risk factors (such as having a new sexual partner) need to be reassessed to determine if screening should resume. Some women have medical problems that increase the chance of getting cervical cancer. In these cases, your health care provider may recommend more frequent screening and Pap tests. Colorectal Cancer  This type of cancer can be detected and often prevented.  Routine colorectal cancer screening usually begins at 66 years of age and continues through 66 years of age.  Your health care provider may recommend screening at an earlier age if you have risk factors for colon cancer.  Your health care provider may also recommend using home test kits to check for hidden blood in  the stool.  A small camera at the end of a tube can be used to examine your colon directly (sigmoidoscopy or colonoscopy). This is done to check for the earliest forms of colorectal cancer.  Routine screening usually begins at age 56.  Direct examination of the colon should be repeated every 5-10 years through 66 years of age. However, you may need to be screened more often if early forms of precancerous polyps or small growths are found. Skin Cancer  Check your skin from head to toe regularly.  Tell your health care provider about any new moles or changes in moles, especially if there is a change in a mole's shape or color.  Also tell your health care provider if you have a mole that is larger than the size of a pencil eraser.  Always use sunscreen. Apply sunscreen liberally and repeatedly throughout the day.  Protect yourself by wearing long sleeves, pants, a wide-brimmed hat, and sunglasses whenever you are  outside. HEART DISEASE, DIABETES, AND HIGH BLOOD PRESSURE   High blood pressure causes heart disease and increases the risk of stroke. High blood pressure is more likely to develop in:  People who have blood pressure in the high end of the normal range (130-139/85-89 mm Hg).  People who are overweight or obese.  People who are African American.  If you are 18-39 years of age, have your blood pressure checked every 3-5 years. If you are 40 years of age or older, have your blood pressure checked every year. You should have your blood pressure measured twice--once when you are at a hospital or clinic, and once when you are not at a hospital or clinic. Record the average of the two measurements. To check your blood pressure when you are not at a hospital or clinic, you can use:  An automated blood pressure machine at a pharmacy.  A home blood pressure monitor.  If you are between 55 years and 79 years old, ask your health care provider if you should take aspirin to prevent  strokes.  Have regular diabetes screenings. This involves taking a blood sample to check your fasting blood sugar level.  If you are at a normal weight and have a low risk for diabetes, have this test once every three years after 66 years of age.  If you are overweight and have a high risk for diabetes, consider being tested at a younger age or more often. PREVENTING INFECTION  Hepatitis B  If you have a higher risk for hepatitis B, you should be screened for this virus. You are considered at high risk for hepatitis B if:  You were born in a country where hepatitis B is common. Ask your health care provider which countries are considered high risk.  Your parents were born in a high-risk country, and you have not been immunized against hepatitis B (hepatitis B vaccine).  You have HIV or AIDS.  You use needles to inject street drugs.  You live with someone who has hepatitis B.  You have had sex with someone who has hepatitis B.  You get hemodialysis treatment.  You take certain medicines for conditions, including cancer, organ transplantation, and autoimmune conditions. Hepatitis C  Blood testing is recommended for:  Everyone born from 1945 through 1965.  Anyone with known risk factors for hepatitis C. Sexually transmitted infections (STIs)  You should be screened for sexually transmitted infections (STIs) including gonorrhea and chlamydia if:  You are sexually active and are younger than 66 years of age.  You are older than 66 years of age and your health care provider tells you that you are at risk for this type of infection.  Your sexual activity has changed since you were last screened and you are at an increased risk for chlamydia or gonorrhea. Ask your health care provider if you are at risk.  If you do not have HIV, but are at risk, it may be recommended that you take a prescription medicine daily to prevent HIV infection. This is called pre-exposure prophylaxis  (PrEP). You are considered at risk if:  You are sexually active and do not regularly use condoms or know the HIV status of your partner(s).  You take drugs by injection.  You are sexually active with a partner who has HIV. Talk with your health care provider about whether you are at high risk of being infected with HIV. If you choose to begin PrEP, you should first be tested for HIV.   You should then be tested every 3 months for as long as you are taking PrEP.  PREGNANCY   If you are premenopausal and you may become pregnant, ask your health care provider about preconception counseling.  If you may become pregnant, take 400 to 800 micrograms (mcg) of folic acid every day.  If you want to prevent pregnancy, talk to your health care provider about birth control (contraception). OSTEOPOROSIS AND MENOPAUSE   Osteoporosis is a disease in which the bones lose minerals and strength with aging. This can result in serious bone fractures. Your risk for osteoporosis can be identified using a bone density scan.  If you are 36 years of age or older, or if you are at risk for osteoporosis and fractures, ask your health care provider if you should be screened.  Ask your health care provider whether you should take a calcium or vitamin D supplement to lower your risk for osteoporosis.  Menopause may have certain physical symptoms and risks.  Hormone replacement therapy may reduce some of these symptoms and risks. Talk to your health care provider about whether hormone replacement therapy is right for you.  HOME CARE INSTRUCTIONS   Schedule regular health, dental, and eye exams.  Stay current with your immunizations.   Do not use any tobacco products including cigarettes, chewing tobacco, or electronic cigarettes.  If you are pregnant, do not drink alcohol.  If you are breastfeeding, limit how much and how often you drink alcohol.  Limit alcohol intake to no more than 1 drink per day for  nonpregnant women. One drink equals 12 ounces of beer, 5 ounces of wine, or 1 ounces of hard liquor.  Do not use street drugs.  Do not share needles.  Ask your health care provider for help if you need support or information about quitting drugs.  Tell your health care provider if you often feel depressed.  Tell your health care provider if you have ever been abused or do not feel safe at home.   This information is not intended to replace advice given to you by your health care provider. Make sure you discuss any questions you have with your health care provider.   Document Released: 03/04/2011 Document Revised: 09/09/2014 Document Reviewed: 07/21/2013 Elsevier Interactive Patient Education [employer].

## 2015-10-09 NOTE — Assessment & Plan Note (Signed)
Has not had colon cancer screening and declines today, Immunizations not up to date and she declines at this time. Checking screening labs and counseled about advanced directives and sun protection and mole surveillance. Given 10 year screening recommendations.

## 2015-10-09 NOTE — Assessment & Plan Note (Signed)
Checking TSH and free T4 adjust as needed. Taking synthroid 100 mcg daily.

## 2015-10-10 LAB — HEPATITIS C ANTIBODY: HCV Ab: NEGATIVE

## 2015-11-02 DIAGNOSIS — L819 Disorder of pigmentation, unspecified: Secondary | ICD-10-CM | POA: Diagnosis not present

## 2015-11-02 DIAGNOSIS — Z411 Encounter for cosmetic surgery: Secondary | ICD-10-CM | POA: Diagnosis not present

## 2015-11-02 DIAGNOSIS — L821 Other seborrheic keratosis: Secondary | ICD-10-CM | POA: Diagnosis not present

## 2015-11-13 ENCOUNTER — Other Ambulatory Visit: Payer: Self-pay | Admitting: Internal Medicine

## 2015-11-13 MED ORDER — LEVOTHYROXINE SODIUM 100 MCG PO TABS: 100.0000 ug | ORAL_TABLET | Freq: Every day | ORAL | Status: DC

## 2016-06-19 ENCOUNTER — Telehealth: Payer: Self-pay | Admitting: Internal Medicine

## 2016-06-19 NOTE — Telephone Encounter (Signed)
Patient would like to know if it is time for her to get a pneumonia vac again?

## 2016-06-19 NOTE — Telephone Encounter (Signed)
It is time for the patient's next pneumonia vaccine. She is aware.

## 2016-06-20 ENCOUNTER — Ambulatory Visit (INDEPENDENT_AMBULATORY_CARE_PROVIDER_SITE_OTHER): Payer: PPO

## 2016-06-20 DIAGNOSIS — Z23 Encounter for immunization: Secondary | ICD-10-CM | POA: Diagnosis not present

## 2016-06-20 NOTE — Addendum Note (Signed)
Addended by: Lodema PilotBOYD, Treon Kehl D on: 06/20/2016 04:16 PM   Modules accepted: Orders

## 2016-07-05 DIAGNOSIS — D075 Carcinoma in situ of prostate: Secondary | ICD-10-CM | POA: Diagnosis not present

## 2016-07-05 DIAGNOSIS — R972 Elevated prostate specific antigen [PSA]: Secondary | ICD-10-CM | POA: Diagnosis not present

## 2016-09-27 ENCOUNTER — Encounter: Payer: Self-pay | Admitting: Internal Medicine

## 2016-09-27 ENCOUNTER — Ambulatory Visit (INDEPENDENT_AMBULATORY_CARE_PROVIDER_SITE_OTHER): Payer: PPO | Admitting: Internal Medicine

## 2016-09-27 DIAGNOSIS — H6121 Impacted cerumen, right ear: Secondary | ICD-10-CM | POA: Diagnosis not present

## 2016-09-27 NOTE — Progress Notes (Signed)
Pre visit review using our clinic review tool, if applicable. No additional management support is needed unless otherwise documented below in the visit note. 

## 2016-09-27 NOTE — Progress Notes (Signed)
   Subjective:    Patient ID: Megan Romero, female    DOB: 08/18/1951, 66 y.o.   MRN: [id number]  HPI The patient is a 66 YO female coming in for ear pain. Going on for about 1 week. Is also blocking her hearing. She has tried to clear the ear canal and was unable. She also tried ear candle and this was not effective. She denies sinus pain or drainage. No cough or SOB.   Review of Systems  Constitutional: Negative for activity change, appetite change, chills, fatigue and unexpected weight change.  HENT: Positive for ear pain and hearing loss. Negative for congestion, ear discharge, postnasal drip, rhinorrhea and sinus pain.   Respiratory: Negative.   Cardiovascular: Negative.   Gastrointestinal: Negative.   Musculoskeletal: Negative.   Skin: Negative.       Objective:   Physical Exam  Constitutional: She appears well-developed and well-nourished.  HENT:  Head: Normocephalic and atraumatic.  Left Ear: External ear normal.  Nose: Nose normal.  Mouth/Throat: Oropharynx is clear and moist.  Right ear blocked with cerumen, disimpacted and TM normal after irrigation.   Eyes: EOM are normal. Pupils are equal, round, and reactive to light.  Neck: Normal range of motion.  Cardiovascular: Normal rate and regular rhythm.   Pulmonary/Chest: Effort normal and breath sounds normal.  Abdominal: Soft.   Vitals:   09/27/16 1600  BP: (!) 104/58  Pulse: 82  Temp: 97.6 F (36.4 C)  TempSrc: Oral  SpO2: 99%  Weight: 149 lb (67.6 kg)  Height: 5' 7.5" (1.715 m)      Assessment & Plan:

## 2016-09-28 NOTE — Assessment & Plan Note (Signed)
Hearing restored with irrigation and TM normal without any infection. No indication for further treatment.

## 2016-09-30 NOTE — Progress Notes (Signed)
[name] [address] [place], [place] [address] Phone: [phone] Subjective:    I'm seeing this patient by the request  of:  Myrlene Broker, MD   CC: Foot pain  [id number]  Megan Romero is a 66 y.o. female coming in with complaint of foot pain. Right -sided. Seems to be the first toe. Patient states it seems to be localized to this area. Affecting her golf game. Patient states that she is concerned in with the season approaching she will not be able to play. Affects some of her daily activities. Possibly associated with some swelling. Denies any numbness. Patient denies any back pain. Does affect relation walks. Has noticed some improvement with different shoes.     Past Medical History:  Diagnosis Date  . Depression   . Thyroid disease    Hypothyroidism   Past Surgical History:  Procedure Laterality Date  . ABDOMINAL HYSTERECTOMY    . CARDIAC ELECTROPHYSIOLOGY MAPPING AND ABLATION    . CARPAL TUNNEL RELEASE     b/l   Social History   Social History  . Marital status: Widowed    Spouse name: N/A  . Number of children: N/A  . Years of education: N/A   Social History Main Topics  . Smoking status: Current Every Day Smoker    Types: Cigarettes  . Smokeless tobacco: Never Used  . Alcohol use Yes  . Drug use: No  . Sexual activity: Not Currently    Partners: Male   Other Topics Concern  . None   Social History Narrative  . None   Allergies  Allergen Reactions  . Penicillins    Family History  Problem Relation Age of Onset  . Heart disease Mother     mvp  . Alzheimer's disease Father     Past medical history, social, surgical and family history all reviewed in electronic medical record.  No pertanent information unless stated regarding to the chief complaint.   Review of Systems:Review of systems updated and as accurate as of 10/01/16  No headache, visual changes, nausea, vomiting, diarrhea, constipation,  dizziness, abdominal pain, skin rash, fevers, chills, night sweats, weight loss, swollen lymph nodes, body aches, joint swelling, muscle aches, chest pain, shortness of breath, mood changes.   Objective  Blood pressure 112/70, pulse 91, height 5' 7.5" (1.715 m), weight 150 lb (68 kg), SpO2 97 %. Systems examined below as of 10/01/16   General: No apparent distress alert and oriented x3 mood and affect normal, dressed appropriately.  HEENT: Pupils equal, extraocular movements intact  Respiratory: Patient's speak in full sentences and does not appear short of breath  Cardiovascular: No lower extremity edema, non tender, no erythema  Skin: Warm dry intact with no signs of infection or rash on extremities or on axial skeleton.  Abdomen: Soft nontender  Neuro: Cranial nerves II through XII are intact, neurovascularly intact in all extremities with 2+ DTRs and 2+ pulses.  Lymph: No lymphadenopathy of posterior or anterior cervical chain or axillae bilaterally.  Gait mild antalgic gait  MSK:  Non tender with full range of motion and good stability and symmetric strength and tone of shoulders, elbows, wrist, hip, knee and ankles bilaterally.  Foot exam shows the patient does have severe breakdown of the transverse arch. Bunion and bunionette formation noted. Patient has mild chronic subluxation of the first toe laterally. Neurovascularly intact. Mild hallux rigidus. Pain with grind test. Full range of motion of the ankle. Good dorsalis  pedis pulse 2+   Limited muscular skeletal ultrasound was performed and interpreted by Judi SaaZachary M Smith . Limited ultrasound shows patient does have mild arthritic changes of the first metatarsal joint. Patient does have a significant synovitis noted. No significant bony normality otherwise. No tendinopathy. Impression: Synovitis of the first toe.   Impression and Recommendations:     This case required medical decision making of moderate complexity.      Note:  This dictation was prepared with Dragon dictation along with smaller phrase technology. Any transcriptional errors that result from this process are unintentional.

## 2016-10-01 ENCOUNTER — Encounter: Payer: Self-pay | Admitting: Family Medicine

## 2016-10-01 ENCOUNTER — Ambulatory Visit: Payer: Self-pay

## 2016-10-01 ENCOUNTER — Ambulatory Visit (INDEPENDENT_AMBULATORY_CARE_PROVIDER_SITE_OTHER): Payer: PPO | Admitting: Family Medicine

## 2016-10-01 VITALS — BP 112/70 | HR 91 | Ht 67.5 in | Wt 150.0 lb

## 2016-10-01 DIAGNOSIS — M216X1 Other acquired deformities of right foot: Secondary | ICD-10-CM | POA: Diagnosis not present

## 2016-10-01 DIAGNOSIS — M659 Synovitis and tenosynovitis, unspecified: Secondary | ICD-10-CM | POA: Diagnosis not present

## 2016-10-01 DIAGNOSIS — M79674 Pain in right toe(s): Secondary | ICD-10-CM

## 2016-10-01 HISTORY — DX: Other acquired deformities of right foot: M21.6X1

## 2016-10-01 NOTE — Assessment & Plan Note (Signed)
Synovitis of the toe noted. Patient declined any type of injection. We'll try topical anti-inflammatories and icing protocol. Patient will try more of a rigid shoe. Discussed exercises for the breakdown of the transverse arch. Follow-up again in 4 weeks.

## 2016-10-01 NOTE — Patient Instructions (Signed)
Good to see you.  Ice 20 minutes 2 times daily. Usually after activity and before bed. pennsaid pinkie amount topically 2 times daily as needed.  Spenco orthotics "total support" online would be great  Good shoes with rigid bottom.  Dierdre HarnessKeen, Dansko, Merrell or New balance greater then 700 Never go barefoot.  Exercises 3 times a week.  See em again in 3 weeks and if not better we will do injection.

## 2016-10-01 NOTE — Assessment & Plan Note (Signed)
Patient given a home exercise by athletic trainer today. Discussed proper shoes and over-the-counter orthotics. Icing regimen given. Follow-up again in 4 weeks

## 2016-10-21 NOTE — Progress Notes (Signed)
[name] [address] [place], [place] [address] Phone: [phone] Subjective:      CC: Foot pain f/u  BJY:[id number]:Subjective  Megan MunroeDeborah E Romero is a 66 y.o. female coming in with complaint of foot pain. Right -sided. Was having more pain on the right toe. Patient's had more of a synovitis. States that she seems to be doing better. Patient states though that the over-the-counter orthotics and shoes do seem to be slightly improved.  Patient feels that the orthotics do not seem to be specific. Patient states that sometimes it seems to aggravate it if she wears into long. Trying to get better overall but is having difficulty.     Past Medical History:  Diagnosis Date  . Depression   . Thyroid disease    Hypothyroidism   Past Surgical History:  Procedure Laterality Date  . ABDOMINAL HYSTERECTOMY    . CARDIAC ELECTROPHYSIOLOGY MAPPING AND ABLATION    . CARPAL TUNNEL RELEASE     b/l   Social History   Social History  . Marital status: Widowed    Spouse name: N/A  . Number of children: N/A  . Years of education: N/A   Social History Main Topics  . Smoking status: Current Every Day Smoker    Types: Cigarettes  . Smokeless tobacco: Never Used  . Alcohol use Yes  . Drug use: No  . Sexual activity: Not Currently    Partners: Male   Other Topics Concern  . None   Social History Narrative  . None   Allergies  Allergen Reactions  . Penicillins    Family History  Problem Relation Age of Onset  . Heart disease Mother     mvp  . Alzheimer's disease Father     Past medical history, social, surgical and family history all reviewed in electronic medical record.  No pertanent information unless stated regarding to the chief complaint.   Review of Systems: No headache, visual changes, nausea, vomiting, diarrhea, constipation, dizziness, abdominal pain, skin rash, fevers, chills, night sweats, weight loss, swollen lymph nodes, body aches, joint  swelling, muscle aches, chest pain, shortness of breath, mood changes.     Objective  Blood pressure 120/70, pulse 85, height 5' 7.5" (1.715 m), weight 149 lb (67.6 kg), SpO2 98 %.   Systems examined below as of 10/22/16 General: NAD A&O x3 mood, affect normal  HEENT: Pupils equal, extraocular movements intact no nystagmus Respiratory: not short of breath at rest or with speaking Cardiovascular: No lower extremity edema, non tender Skin: Warm dry intact with no signs of infection or rash on extremities or on axial skeleton. Abdomen: Soft nontender, no masses Neuro: Cranial nerves  intact, neurovascularly intact in all extremities with 2+ DTRs and 2+ pulses. Lymph: No lymphadenopathy appreciated today  Gait normal with good balance and coordination.  MSK: Non tender with full range of motion and good stability and symmetric strength and tone of shoulders, elbows, wrist,  knee hips and ankles bilaterally.     foot exam shows the patient does not have any significant swelling of the first metatarsal. Mild hallux limit is noted. Breakdown of transverse arch still noted. Mild tenderness over the first MTP but significantly improved from previous exam.     Impression and Recommendations:     This case required medical decision making of moderate complexity.      Note: This dictation was prepared with Dragon dictation along with smaller phrase technology. Any transcriptional errors that result  from this process are unintentional.

## 2016-10-22 ENCOUNTER — Ambulatory Visit (INDEPENDENT_AMBULATORY_CARE_PROVIDER_SITE_OTHER): Payer: PPO | Admitting: Family Medicine

## 2016-10-22 ENCOUNTER — Encounter: Payer: Self-pay | Admitting: Family Medicine

## 2016-10-22 DIAGNOSIS — M659 Synovitis and tenosynovitis, unspecified: Secondary | ICD-10-CM

## 2016-10-22 DIAGNOSIS — M216X1 Other acquired deformities of right foot: Secondary | ICD-10-CM

## 2016-10-22 NOTE — Assessment & Plan Note (Signed)
Resolved

## 2016-10-22 NOTE — Assessment & Plan Note (Signed)
Discussed with patient at great length. We discussed icing regimen and home exercises. We discussed which activities doing which was to avoid. Patient will start increasing activity as tolerated. Follow-up again with me in 4 weeks. Patient may need custom orthotics, formal physical therapy or consider injection.

## 2016-10-22 NOTE — Patient Instructions (Addendum)
Good to see you  Ice is your friend See me again in 3 weeks.  We will make changes to orthotics.

## 2016-11-12 ENCOUNTER — Ambulatory Visit: Payer: PPO | Admitting: Family Medicine

## 2016-12-24 ENCOUNTER — Other Ambulatory Visit: Payer: Self-pay | Admitting: Internal Medicine

## 2017-01-16 ENCOUNTER — Ambulatory Visit: Payer: Self-pay

## 2017-01-16 ENCOUNTER — Encounter: Payer: Self-pay | Admitting: Family Medicine

## 2017-01-16 ENCOUNTER — Ambulatory Visit (INDEPENDENT_AMBULATORY_CARE_PROVIDER_SITE_OTHER): Payer: PPO | Admitting: Family Medicine

## 2017-01-16 VITALS — BP 130/72 | HR 80 | Ht 67.5 in | Wt 146.0 lb

## 2017-01-16 DIAGNOSIS — S8011XA Contusion of right lower leg, initial encounter: Secondary | ICD-10-CM | POA: Diagnosis not present

## 2017-01-16 DIAGNOSIS — M25561 Pain in right knee: Secondary | ICD-10-CM

## 2017-01-16 MED ORDER — VITAMIN D (ERGOCALCIFEROL) 1.25 MG (50000 UNIT) PO CAPS: 50000.0000 [IU] | ORAL_CAPSULE | ORAL | 0 refills | Status: DC

## 2017-01-16 NOTE — Assessment & Plan Note (Signed)
Significant contusion noted. I do not see any type of critical defect. We'll do a once weekly vitamin D, icing, topical over-the-counter medications for the bruising. We discussed which activities to do in which ones to avoid. Patient come back again in 2-3 weeks for further evaluation. Worsening symptoms advance imaging could be warranted.

## 2017-01-16 NOTE — Patient Instructions (Addendum)
Good to see you  Bone bruise with a small chip Once weekly vitamin D for 12 weeks.  Arnica lotion 2 times daily  Ice 20 minutes 2-3 times a day  OK to do what you want.

## 2017-01-16 NOTE — Progress Notes (Signed)
[name] [address] [place], [place] [address] Phone: [phone] Subjective:    I'm seeing this patient by the request  of:    CC: knee pain   [id number]  Megan Romero is a 66 y.o. female coming in with complaint of knee pain, right sided, hit by golf ball 2 days ago.  Pain immediately.  Could not bear weight for 24 hours.  Brusing, pain and swelling immediately. Has been icing and NSAIDs.  Mild improvement. Patient states that maybe has had a long improvement over the course of time. Is able to walk now but still has pain with any type of palpation.     Past Medical History:  Diagnosis Date  . Depression   . Thyroid disease    Hypothyroidism   Past Surgical History:  Procedure Laterality Date  . ABDOMINAL HYSTERECTOMY    . CARDIAC ELECTROPHYSIOLOGY MAPPING AND ABLATION    . CARPAL TUNNEL RELEASE     b/l   Social History   Social History  . Marital status: Widowed    Spouse name: N/A  . Number of children: N/A  . Years of education: N/A   Social History Main Topics  . Smoking status: Current Every Day Smoker    Types: Cigarettes  . Smokeless tobacco: Never Used  . Alcohol use Yes  . Drug use: No  . Sexual activity: Not Currently    Partners: Male   Other Topics Concern  . Not on file   Social History Narrative  . No narrative on file   Allergies  Allergen Reactions  . Penicillins    Family History  Problem Relation Age of Onset  . Heart disease Mother        mvp  . Alzheimer's disease Father     Past medical history, social, surgical and family history all reviewed in electronic medical record.  No pertanent information unless stated regarding to the chief complaint.   Review of Systems:Review of systems updated and as accurate as of 01/16/17  No headache, visual changes, nausea, vomiting, diarrhea, constipation, dizziness, abdominal pain, skin rash, fevers, chills, night sweats, weight loss, swollen lymph  nodes, body aches, joint swelling, muscle aches, chest pain, shortness of breath, mood changes.   Objective  There were no vitals taken for this visit. Systems examined below as of 01/16/17   General: No apparent distress alert and oriented x3 mood and affect normal, dressed appropriately.  HEENT: Pupils equal, extraocular movements intact  Respiratory: Patient's speak in full sentences and does not appear short of breath  Cardiovascular: No lower extremity edema, non tender, no erythema  Skin: Warm dry intact with no signs of infection or rash on extremities or on axial skeleton.  Abdomen: Soft nontender  Neuro: Cranial nerves II through XII are intact, neurovascularly intact in all extremities with 2+ DTRs and 2+ pulses.  Lymph: No lymphadenopathy of posterior or anterior cervical chain or axillae bilaterally.  Gait antalgic gait MSK:  Non tender with full range of motion and good stability and symmetric strength and tone of shoulders, elbows, wrist, hip,and ankles bilaterally. Mild arthritic changes of multiple joints  Knee: Right Swelling over the anterior tibia with bruising noted Severe tenderness over the anterior tibia.Marland Kitchen ROM full in flexion and extension and lower leg rotation. Ligaments with solid consistent endpoints including ACL, PCL, LCL, MCL. Negative Mcmurray's, Apley's, and Thessalonian tests. Hamstring and quadriceps strength is normal.    MSK US performed of: right  This study was ordered, performed, and interpreted by Terrilee FilesZach Smith D.O.  Knee: Anterior tibia and that show the patient does have hypoechoic changes and increasing Doppler flow on the proximal tibia. No true cortical defect noted. Significant soft tissue swelling in the area. Mild hematoma noted.  IMPRESSION:  Tibial bone contusion     Impression and Recommendations:     This case required medical decision making of moderate complexity.      Note: This dictation was prepared with Dragon  dictation along with smaller phrase technology. Any transcriptional errors that result from this process are unintentional.

## 2017-01-28 DIAGNOSIS — R972 Elevated prostate specific antigen [PSA]: Secondary | ICD-10-CM | POA: Diagnosis not present

## 2017-02-14 ENCOUNTER — Encounter: Payer: Self-pay | Admitting: Family Medicine

## 2017-02-20 ENCOUNTER — Encounter: Payer: Self-pay | Admitting: Family Medicine

## 2017-02-20 ENCOUNTER — Ambulatory Visit (INDEPENDENT_AMBULATORY_CARE_PROVIDER_SITE_OTHER)
Admission: RE | Admit: 2017-02-20 | Discharge: 2017-02-20 | Disposition: A | Discharge status: Home / Self Care | Payer: PPO | Source: Ambulatory Visit | Attending: Family Medicine | Admitting: Family Medicine

## 2017-02-20 ENCOUNTER — Ambulatory Visit (INDEPENDENT_AMBULATORY_CARE_PROVIDER_SITE_OTHER): Payer: PPO | Admitting: Family Medicine

## 2017-02-20 ENCOUNTER — Ambulatory Visit: Payer: Self-pay

## 2017-02-20 VITALS — BP 110/64 | HR 82 | Ht 67.0 in | Wt 147.0 lb

## 2017-02-20 DIAGNOSIS — S8011XA Contusion of right lower leg, initial encounter: Secondary | ICD-10-CM | POA: Diagnosis not present

## 2017-02-20 DIAGNOSIS — M25562 Pain in left knee: Secondary | ICD-10-CM

## 2017-02-20 DIAGNOSIS — M25561 Pain in right knee: Secondary | ICD-10-CM | POA: Diagnosis not present

## 2017-02-20 DIAGNOSIS — M1711 Unilateral primary osteoarthritis, right knee: Secondary | ICD-10-CM | POA: Diagnosis not present

## 2017-02-20 NOTE — Assessment & Plan Note (Signed)
I believe the patient's standing seems to be more of a heterotropic calcific mass at this time. I do think that the hematoma has calcified. We discussed with patient at great length. We'll try heat and massage. Patient will get x-rays to make sure that there is no intra-articular pathology such as a tibial plateau fracture that could be contribute in. I think this is low likelihood based on exam today. We discussed icing regimen, discussed which activities doing which ones to avoid. Patient will increase activity. Follow-up again in 3 weeks. Continued have pain we may need to consider injection.Spent  25 minutes with patient face-to-face and had greater than 50% of counseling including as described above in assessment and plan including advance imaging, different treatment options, as well as possible future calcific injections and potentially physical therapy..Marland Kitchen

## 2017-02-20 NOTE — Progress Notes (Signed)
[facility] [address] [place], [place] [address] Phone: [phone]) [phone] Subjective:    I'm seeing this patient by the request  of:    CC: knee pain f/u  MVH:[id number]:Subjective  Megan MunroeDeborah E Romero is a 66 y.o. female coming in with complaint of knee pain, right sided, Patient was seen 6 weeks ago and did have what appeared to be a contusion on the tibia. Patient states that she still has swelling. States that in certain ways it seems to be better with daily activities now more with the sharp pain with direct palpation. Patient is concerned because it is been 6 weeks since injury and continuing to have discomfort and pain.    Past Medical History:  Diagnosis Date  . Depression   . Thyroid disease    Hypothyroidism   Past Surgical History:  Procedure Laterality Date  . ABDOMINAL HYSTERECTOMY    . CARDIAC ELECTROPHYSIOLOGY MAPPING AND ABLATION    . CARPAL TUNNEL RELEASE     b/l   Social History   Social History  . Marital status: Widowed    Spouse name: N/A  . Number of children: N/A  . Years of education: N/A   Social History Main Topics  . Smoking status: Current Every Day Smoker    Types: Cigarettes  . Smokeless tobacco: Never Used  . Alcohol use Yes  . Drug use: No  . Sexual activity: Not Currently    Partners: Male   Other Topics Concern  . None   Social History Narrative  . None   Allergies  Allergen Reactions  . Penicillins    Family History  Problem Relation Age of Onset  . Heart disease Mother        mvp  . Alzheimer's disease Father     Past medical history, social, surgical and family history all reviewed in electronic medical record.  No pertanent information unless stated regarding to the chief complaint.   Review of Systems:Review of systems updated and as accurate as of 02/20/17  No headache, visual changes, nausea, vomiting, diarrhea, constipation, dizziness, abdominal pain, skin rash, fevers, chills, night sweats, weight  loss, swollen lymph nodes, body aches, joint swelling, muscle aches, chest pain, shortness of breath, mood changes.   Objective  Blood pressure 110/64, pulse 82, height 5\' 7"  (1.702 m), weight 147 lb (66.7 kg).   Systems examined below as of 02/20/17 General: NAD A&O x3 mood, affect normal  HEENT: Pupils equal, extraocular movements intact no nystagmus Respiratory: not short of breath at rest or with speaking Cardiovascular: No lower extremity edema, non tender Skin: Warm dry intact with no signs of infection or rash on extremities or on axial skeleton. Abdomen: Soft nontender, no masses Neuro: Cranial nerves  intact, neurovascularly intact in all extremities with 2+ DTRs and 2+ pulses. Lymph: No lymphadenopathy appreciated today  Gait normal with good balance and coordination.  MSK:  Non tender with full range of motion and good stability and symmetric strength and tone of shoulders, elbows, wrist, hip,and ankles bilaterally. Mild arthritic changes of multiple joints  Knee: Right Continued hypertrophia over the anterior aspect of the tibia Mild tenderness still over the anterior tibia. Seems harder than what it was previously ROM full in flexion and extension and lower leg rotation. Ligaments with solid consistent endpoints including ACL, PCL, LCL, MCL. Negative Mcmurray's, Apley's, and Thessalonian tests. Hamstring and quadriceps strength is normal.    MSK US performed of: right  This study was ordered,  performed, and interpreted by Terrilee Files D.O.  Knee: Anterior tibia and that show the patient does have less hypoechoic change patient does have significant calcific changes of the anterior aspect. Increasing Doppler flow still noted. Possible carpal defect noted.  IMPRESSION:  Tibial bone contusion now heterotropic calcific changes     Impression and Recommendations:     This case required medical decision making of moderate complexity.      Note: This dictation was  prepared with Dragon dictation along with smaller phrase technology. Any transcriptional errors that result from this process are unintentional.

## 2017-02-20 NOTE — Patient Instructions (Signed)
Good to see you  Heat 10 min at least 3 times a day and try to massage the area.  Patella strap with a lot of walking may help Xray downstairs See me again in 3 weeks and if not better lets consider injection or PT

## 2017-03-11 ENCOUNTER — Encounter: Payer: Self-pay | Admitting: Internal Medicine

## 2017-03-13 ENCOUNTER — Other Ambulatory Visit (INDEPENDENT_AMBULATORY_CARE_PROVIDER_SITE_OTHER): Payer: PPO

## 2017-03-13 ENCOUNTER — Ambulatory Visit (INDEPENDENT_AMBULATORY_CARE_PROVIDER_SITE_OTHER): Payer: PPO | Admitting: Nurse Practitioner

## 2017-03-13 ENCOUNTER — Encounter: Payer: Self-pay | Admitting: Nurse Practitioner

## 2017-03-13 VITALS — BP 116/70 | HR 60 | Temp 97.7°F | Ht 67.0 in | Wt 146.0 lb

## 2017-03-13 DIAGNOSIS — E039 Hypothyroidism, unspecified: Secondary | ICD-10-CM

## 2017-03-13 DIAGNOSIS — J0140 Acute pansinusitis, unspecified: Secondary | ICD-10-CM

## 2017-03-13 DIAGNOSIS — J209 Acute bronchitis, unspecified: Secondary | ICD-10-CM | POA: Diagnosis not present

## 2017-03-13 LAB — T4, FREE: Free T4: 0.53 ng/dL — ABNORMAL LOW (ref 0.60–1.60)

## 2017-03-13 LAB — TSH: TSH: 10.91 u[IU]/mL — ABNORMAL HIGH (ref 0.35–4.50)

## 2017-03-13 MED ORDER — FLUTICASONE PROPIONATE 50 MCG/ACT NA SUSP: 2.0000 | Freq: Every day | NASAL | 0 refills | Status: DC

## 2017-03-13 MED ORDER — LEVOTHYROXINE SODIUM 125 MCG PO TABS: 125.0000 ug | ORAL_TABLET | Freq: Every day | ORAL | 1 refills | Status: DC

## 2017-03-13 MED ORDER — BENZONATATE 100 MG PO CAPS: 100.0000 mg | ORAL_CAPSULE | Freq: Three times a day (TID) | ORAL | 0 refills | Status: DC | PRN

## 2017-03-13 MED ORDER — ALBUTEROL SULFATE HFA 108 (90 BASE) MCG/ACT IN AERS: 2.0000 | INHALATION_SPRAY | Freq: Four times a day (QID) | RESPIRATORY_TRACT | 0 refills | Status: DC | PRN

## 2017-03-13 MED ORDER — GUAIFENESIN ER 600 MG PO TB12: 600.0000 mg | ORAL_TABLET | Freq: Two times a day (BID) | ORAL | 0 refills | Status: DC | PRN

## 2017-03-13 MED ORDER — LEVOFLOXACIN 500 MG PO TABS: 500.0000 mg | ORAL_TABLET | Freq: Every day | ORAL | 0 refills | Status: DC

## 2017-03-13 NOTE — Patient Instructions (Signed)
Congratulations on quitting. Keep up the good work.  Encourage adequate oral hydration. Return to office if no improvement in 1week.  Go to basement for blood draw. You will be called with results. Will send levothyroxine prescription after review of labs.

## 2017-03-13 NOTE — Progress Notes (Addendum)
Subjective:  Patient ID: Megan Romero, female    DOB: 1951/01/12  Age: 66 y.o. MRN: [id number]  CC: Nasal Congestion (congestion,coughing green mucus at times going on 1 wk. )   URI   This is a new problem. The current episode started 1 to 4 weeks ago. The problem has been gradually worsening. The maximum temperature recorded prior to her arrival was 100.4 - 100.9 F. Associated symptoms include congestion, coughing, headaches, rhinorrhea, a sore throat and swollen glands. Pertinent negatives include no chest pain, diarrhea, dysuria, ear pain, neck pain, plugged ear sensation or wheezing. She has tried nothing for the symptoms.   Hypothyroidism: Complains of increased fatigue with current dose.  Outpatient Medications Prior to Visit  Medication Sig Dispense Refill  . acetaminophen (TYLENOL) 500 MG tablet Take 500 mg by mouth every 6 (six) hours as needed.    Marland Kitchen aspirin 81 MG tablet Take 81 mg by mouth daily.      Marland Kitchen levothyroxine (SYNTHROID, LEVOTHROID) 100 MCG tablet TAKE ONE TABLET BY MOUTH ONCE DAILY BEFORE BREAKFAST 90 tablet 0  . Vitamin D, Ergocalciferol, (DRISDOL) 50000 units CAPS capsule Take 1 capsule (50,000 Units total) by mouth every 7 (seven) days. (Patient not taking: Reported on 03/13/2017) 12 capsule 0   No facility-administered medications prior to visit.     ROS See HPI  Objective:  BP 116/70   Pulse 60   Temp 97.7 F (36.5 C)   Ht 5\' 7"  (1.702 m)   Wt 146 lb (66.2 kg)   SpO2 96%   BMI 22.87 kg/m   BP Readings from Last 3 Encounters:  03/13/17 116/70  02/20/17 110/64  01/16/17 130/72    Wt Readings from Last 3 Encounters:  03/13/17 146 lb (66.2 kg)  02/20/17 147 lb (66.7 kg)  01/16/17 146 lb (66.2 kg)    Physical Exam  Constitutional: She is oriented to person, place, and time. No distress.  HENT:  Right Ear: Tympanic membrane, external ear and ear canal normal.  Left Ear: Tympanic membrane, external ear and ear canal normal.  Nose: Mucosal  edema and rhinorrhea present. Right sinus exhibits maxillary sinus tenderness. Right sinus exhibits no frontal sinus tenderness. Left sinus exhibits maxillary sinus tenderness. Left sinus exhibits no frontal sinus tenderness.  Mouth/Throat: Uvula is midline. No trismus in the jaw. Posterior oropharyngeal erythema present. No oropharyngeal exudate.  Eyes: No scleral icterus.  Neck: Normal range of motion. Neck supple.  Cardiovascular: Normal rate and normal heart sounds.   Pulmonary/Chest: Effort normal and breath sounds normal.  Musculoskeletal: She exhibits no edema.  Lymphadenopathy:    She has cervical adenopathy.  Neurological: She is alert and oriented to person, place, and time.  Vitals reviewed.   Lab Results  Component Value Date   WBC 7.3 05/30/2011   HGB 13.4 05/30/2011   HCT 40.1 05/30/2011   PLT 210.0 05/30/2011   GLUCOSE 95 10/09/2015   CHOL 177 10/09/2015   TRIG 152.0 (H) 10/09/2015   HDL 48.00 10/09/2015   LDLDIRECT 166.7 05/26/2008   LDLCALC 98 10/09/2015   ALT 15 10/09/2015   AST 17 10/09/2015   NA 140 10/09/2015   K 4.6 10/09/2015   CL 105 10/09/2015   CREATININE 0.90 10/09/2015   BUN 25 (H) 10/09/2015   CO2 28 10/09/2015   TSH 10.91 (H) 03/13/2017    [place] Limited Joint Space Structures Low Right  Result Date: 02/26/2017 MSK US performed of: right This study was ordered, performed, and interpreted by  Terrilee FilesZach Smith D.O.  Knee: Anterior tibia and that show the patient does have less hypoechoic change patient does have significant calcific changes of the anterior aspect. Increasing Doppler flow still noted. Possible carpal defect noted.    Tibial bone contusion now heterotropic calcific changes  Dg Knee Complete 4 Views Right  Result Date: 02/20/2017 CLINICAL DATA:  Injured 6 months ago with knot on the proximal aspect of the right lower leg EXAM: RIGHT KNEE - COMPLETE 4+ VIEW COMPARISON:  None FINDINGS: There is mild degenerative joint disease involving the  right knee primarily involving the medial compartment with there is slightly more loss of joint space and spurring present. No acute fracture is seen and no joint effusion is noted. On the lateral view there is slight soft tissue swelling over the proximal right tibia anteriorly which may be at the site of prior injury but no underlying bony abnormality is seen. IMPRESSION: Mild degenerative change particularly involving medial compartment. No acute fracture. Electronically Signed   By: Dwyane DeePaul  Barry M.D.   On: 02/20/2017 15:48    Assessment & Plan:   Megan PoundDeborah was seen today for nasal congestion.  Diagnoses and all orders for this visit:  Acute bronchitis, unspecified organism -     albuterol (PROVENTIL HFA;VENTOLIN HFA) 108 (90 Base) MCG/ACT inhaler; Inhale 2 puffs into the lungs every 6 (six) hours as needed for wheezing or shortness of breath. -     benzonatate (TESSALON) 100 MG capsule; Take 1 capsule (100 mg total) by mouth 3 (three) times daily as needed for cough. -     guaiFENesin (MUCINEX) 600 MG 12 hr tablet; Take 1 tablet (600 mg total) by mouth 2 (two) times daily as needed for cough or to loosen phlegm. -     levofloxacin (LEVAQUIN) 500 MG tablet; Take 1 tablet (500 mg total) by mouth daily.  Hypothyroidism, unspecified type -     TSH; Future -     T4, free; Future -     levothyroxine (SYNTHROID, LEVOTHROID) 125 MCG tablet; Take 1 tablet (125 mcg total) by mouth daily before breakfast.  Acute non-recurrent pansinusitis -     albuterol (PROVENTIL HFA;VENTOLIN HFA) 108 (90 Base) MCG/ACT inhaler; Inhale 2 puffs into the lungs every 6 (six) hours as needed for wheezing or shortness of breath. -     benzonatate (TESSALON) 100 MG capsule; Take 1 capsule (100 mg total) by mouth 3 (three) times daily as needed for cough. -     guaiFENesin (MUCINEX) 600 MG 12 hr tablet; Take 1 tablet (600 mg total) by mouth 2 (two) times daily as needed for cough or to loosen phlegm. -     levofloxacin  (LEVAQUIN) 500 MG tablet; Take 1 tablet (500 mg total) by mouth daily. -     fluticasone (FLONASE) 50 MCG/ACT nasal spray; Place 2 sprays into both nostrils daily.   I have changed Megan Romero's levothyroxine. I am also having her start on albuterol, benzonatate, guaiFENesin, levofloxacin, and fluticasone. Additionally, I am having her maintain her aspirin, acetaminophen, and Vitamin D (Ergocalciferol).  Meds ordered this encounter  Medications  . albuterol (PROVENTIL HFA;VENTOLIN HFA) 108 (90 Base) MCG/ACT inhaler    Sig: Inhale 2 puffs into the lungs every 6 (six) hours as needed for wheezing or shortness of breath.    Dispense:  1 Inhaler    Refill:  0    Order Specific Question:   Supervising Provider    Answer:   Tresa GarterPLOTNIKOV, ALEKSEI V [[id number]]  .  benzonatate (TESSALON) 100 MG capsule    Sig: Take 1 capsule (100 mg total) by mouth 3 (three) times daily as needed for cough.    Dispense:  20 capsule    Refill:  0    Order Specific Question:   Supervising Provider    Answer:   Tresa Garter [[id number]]  . guaiFENesin (MUCINEX) 600 MG 12 hr tablet    Sig: Take 1 tablet (600 mg total) by mouth 2 (two) times daily as needed for cough or to loosen phlegm.    Dispense:  14 tablet    Refill:  0    Order Specific Question:   Supervising Provider    Answer:   Tresa Garter [[id number]]  . levofloxacin (LEVAQUIN) 500 MG tablet    Sig: Take 1 tablet (500 mg total) by mouth daily.    Dispense:  7 tablet    Refill:  0    Order Specific Question:   Supervising Provider    Answer:   Tresa Garter [[id number]]  . fluticasone (FLONASE) 50 MCG/ACT nasal spray    Sig: Place 2 sprays into both nostrils daily.    Dispense:  16 g    Refill:  0    Order Specific Question:   Supervising Provider    Answer:   Tresa Garter [[id number]]  . levothyroxine (SYNTHROID, LEVOTHROID) 125 MCG tablet    Sig: Take 1 tablet (125 mcg total) by mouth daily before breakfast.    Dispense:  90 tablet    Refill:   1    Needs yearly visit for refills    Order Specific Question:   Supervising Provider    Answer:   Tresa Garter [[id number]]    Follow-up: Return in about 6 months (around 09/13/2017) for with Dr. Okey Dupre.  Alysia Penna, NP

## 2017-03-13 NOTE — Addendum Note (Signed)
Addended by: Alysia PennaNCHE, Zyshonne Malecha L on: 03/13/2017 12:10 PM   Modules accepted: Orders

## 2017-03-17 ENCOUNTER — Ambulatory Visit (INDEPENDENT_AMBULATORY_CARE_PROVIDER_SITE_OTHER): Payer: PPO | Admitting: Family Medicine

## 2017-03-17 ENCOUNTER — Encounter: Payer: Self-pay | Admitting: Family Medicine

## 2017-03-17 DIAGNOSIS — S8011XA Contusion of right lower leg, initial encounter: Secondary | ICD-10-CM

## 2017-03-17 NOTE — Progress Notes (Signed)
Pre visit review using our clinic review tool, if applicable. No additional management support is needed unless otherwise documented below in the visit note. 

## 2017-03-17 NOTE — Progress Notes (Signed)
Megan ScaleZach Aleana Romero D.O. [facility] [address] [place], [place] [address] Phone: [phone]([phone] Subjective:    I'm seeing this patient by the request  of:    CC: knee pain f/u  BJY:[id number]:Subjective  Megan MunroeDeborah E Romero is a 66 y.o. female coming in with complaint of knee pain, right sided. Patient did have a contusion in the area. X-rays shows the patient did have some soft tissue changes in the area but no cartilage defect. Patient states that the pain has improved but continues to be concerned about the bump itself. Unable to put pressure on the knee. Patient thinks that this may not get better overall. Denies any instability. Denies any fevers chills or any abnormal weight loss.    Past Medical History:  Diagnosis Date  . Depression   . Thyroid disease    Hypothyroidism   Past Surgical History:  Procedure Laterality Date  . ABDOMINAL HYSTERECTOMY    . CARDIAC ELECTROPHYSIOLOGY MAPPING AND ABLATION    . CARPAL TUNNEL RELEASE     b/l   Social History   Social History  . Marital status: Widowed    Spouse name: N/A  . Number of children: N/A  . Years of education: N/A   Social History Main Topics  . Smoking status: Former Smoker    Types: Cigarettes    Quit date: 02/11/2017  . Smokeless tobacco: Never Used  . Alcohol use Yes  . Drug use: No  . Sexual activity: Not Currently    Partners: Male   Other Topics Concern  . None   Social History Narrative  . None   Allergies  Allergen Reactions  . Penicillins    Family History  Problem Relation Age of Onset  . Heart disease Mother        mvp  . Alzheimer's disease Father     Past medical history, social, surgical and family history all reviewed in electronic medical record.  No pertanent information unless stated regarding to the chief complaint.   Review of Systems:Review of systems updated and as accurate as of 03/17/17  No headache, visual changes, nausea, vomiting, diarrhea, constipation, dizziness, abdominal  pain, skin rash, fevers, chills, night sweats, weight loss, swollen lymph nodes, body aches, joint swelling, chest pain, shortness of breath, mood changes. Positive muscle aches  Objective  Blood pressure 104/70, pulse 71, height 5\' 7"  (1.702 m), weight 147 lb (66.7 kg), SpO2 98 %.   Systems examined below as of 03/17/17 General: NAD A&O x3 mood, affect normal  HEENT: Pupils equal, extraocular movements intact no nystagmus Respiratory: not short of breath at rest or with speaking Cardiovascular: No lower extremity edema, non tender Skin: Warm dry intact with no signs of infection or rash on extremities or on axial skeleton. Abdomen: Soft nontender, no masses Neuro: Cranial nerves  intact, neurovascularly intact in all extremities with 2+ DTRs and 2+ pulses. Lymph: No lymphadenopathy appreciated today  Gait normal with good balance and coordination.  MSK:  Non tender with full range of motion and good stability and symmetric strength and tone of shoulders, elbows, wrist, hip,and ankles bilaterally. Mild arthritic changes of multiple joints  Knee: Right Continued hypertrophicover the anterior aspect of the tibia  very minimal tenderness which is improvement.Continues to harden. No significant increase in size with it being 4 fingerbreadths across the kn\ ROM full in flexion and extension and lower leg rotation. Ligaments with solid consistent endpoints including ACL, PCL, LCL, MCL. Negative Mcmurray's, Apley's, and Thessalonian tests. Hamstring  and quadriceps strength is normal.  Contralateral knee unremarkable    Impression and Recommendations:     This case required medical decision making of moderate complexity.      Note: This dictation was prepared with Dragon dictation along with smaller phrase technology. Any transcriptional errors that result from this process are unintentional.

## 2017-03-17 NOTE — Patient Instructions (Signed)
Good to see you See me again in 2 months 

## 2017-03-17 NOTE — Assessment & Plan Note (Signed)
Discussed with patient at this time. I do believe as a heterotropic e possibility of injection, and imaging but patient declined.Megan Romero. Patien it grows anymore any new symptoms happens. Patient come back again in 6 weeks.

## 2017-03-20 DIAGNOSIS — H524 Presbyopia: Secondary | ICD-10-CM | POA: Diagnosis not present

## 2017-05-20 ENCOUNTER — Telehealth: Payer: Self-pay | Admitting: Internal Medicine

## 2017-05-20 NOTE — Telephone Encounter (Signed)
Called patient back and she has stopped smoking for three months and she cannot handle it. States she has had headaches, anxiety, and claims she cannot sleep "that she could get up and paint the house." wants to know what can she do because she cannot handle the anxiety. States she has been on wellbutrin and had bad side effects. Making appointment to see crawford

## 2017-05-20 NOTE — Telephone Encounter (Signed)
Pt called asking to speak with you.  She said that she was here in July with a lot of infections and she stopped smoking at that time. Since then she has been having side effects from not smoking. She said that she is not sleep well and is having a lot of anxiety. She wanted to talk to you about these issues.

## 2017-05-23 ENCOUNTER — Other Ambulatory Visit (INDEPENDENT_AMBULATORY_CARE_PROVIDER_SITE_OTHER): Payer: PPO

## 2017-05-23 ENCOUNTER — Encounter: Payer: Self-pay | Admitting: Internal Medicine

## 2017-05-23 ENCOUNTER — Ambulatory Visit (INDEPENDENT_AMBULATORY_CARE_PROVIDER_SITE_OTHER): Payer: PPO | Admitting: Internal Medicine

## 2017-05-23 VITALS — BP 110/68 | HR 67 | Temp 98.5°F | Ht 67.0 in | Wt 152.0 lb

## 2017-05-23 DIAGNOSIS — Z716 Tobacco abuse counseling: Secondary | ICD-10-CM | POA: Diagnosis not present

## 2017-05-23 DIAGNOSIS — Z23 Encounter for immunization: Secondary | ICD-10-CM

## 2017-05-23 DIAGNOSIS — E039 Hypothyroidism, unspecified: Secondary | ICD-10-CM | POA: Diagnosis not present

## 2017-05-23 DIAGNOSIS — Z720 Tobacco use: Secondary | ICD-10-CM | POA: Diagnosis not present

## 2017-05-23 LAB — TSH: TSH: 0.62 u[IU]/mL (ref 0.35–4.50)

## 2017-05-23 LAB — T4, FREE: Free T4: 1.17 ng/dL (ref 0.60–1.60)

## 2017-05-23 MED ORDER — ESCITALOPRAM OXALATE 10 MG PO TABS: 10.0000 mg | ORAL_TABLET | Freq: Every day | ORAL | 3 refills | Status: DC

## 2017-05-23 NOTE — Progress Notes (Signed)
   Subjective:    Patient ID: Megan Romero, female    DOB: 30-Jan-1951, 66 y.o.   MRN: [id number]  HPI The patient is a 66 YO female coming in for anxiety from smoking cessation. She has been quit about 3 months now. She is having a lot of anxiety and sleep problems. She denies fevers or chills. Cough is better. Denies SI/HI. She is having some problems with getting to sleep. Not having cravings for cigarettes at this time.   Review of Systems  Constitutional: Negative.   HENT: Negative.   Respiratory: Negative for cough, chest tightness and shortness of breath.   Cardiovascular: Negative for chest pain, palpitations and leg swelling.  Gastrointestinal: Negative for abdominal distention, abdominal pain, constipation, diarrhea, nausea and vomiting.  Musculoskeletal: Negative.   Skin: Negative.   Neurological: Negative.       Objective:   Physical Exam  Constitutional: She is oriented to person, place, and time. She appears well-developed and well-nourished.  HENT:  Head: Normocephalic and atraumatic.  Eyes: EOM are normal.  Neck: Normal range of motion.  Cardiovascular: Normal rate and regular rhythm.   Pulmonary/Chest: Effort normal and breath sounds normal. No respiratory distress. She has no wheezes. She has no rales.  Abdominal: Soft. Bowel sounds are normal. She exhibits no distension. There is no tenderness. There is no rebound.  Musculoskeletal: She exhibits no edema.  Neurological: She is alert and oriented to person, place, and time. Coordination normal.  Skin: Skin is warm and dry.  Psychiatric:  Some anxiety during the visit   Vitals:   05/23/17 1304  BP: 110/68  Pulse: 67  Temp: 98.5 F (36.9 C)  TempSrc: Oral  SpO2: 99%  Weight: 152 lb (68.9 kg)  Height:  (1.702 m)      Assessment & Plan:  Flu shot given at visit.

## 2017-05-23 NOTE — Patient Instructions (Signed)
We have sent in the lexapro (escitalopram) to help with the smoking. Take 1 pill daily and it could take 2 weeks or so to kick in.   We are checking the labs today and will call you back with the results.

## 2017-05-23 NOTE — Assessment & Plan Note (Signed)
Rx for lexapro as wellbutrin caused some problems before. Her main concern now is anxiety. She is not having cravings at this time. Quit for 3 months and she would like to stay quit but struggling.

## 2017-05-23 NOTE — Assessment & Plan Note (Signed)
Checking TSH and free T4, recent dosage adjustment to 125 mcg daily synthroid. Adjust as needed.

## 2017-08-15 ENCOUNTER — Ambulatory Visit: Payer: Self-pay

## 2017-08-15 ENCOUNTER — Encounter: Payer: Self-pay | Admitting: Family Medicine

## 2017-08-15 ENCOUNTER — Ambulatory Visit: Payer: PPO | Admitting: Family Medicine

## 2017-08-15 VITALS — BP 118/68 | HR 79 | Ht 67.5 in | Wt 155.0 lb

## 2017-08-15 DIAGNOSIS — M25561 Pain in right knee: Secondary | ICD-10-CM

## 2017-08-15 DIAGNOSIS — S8011XA Contusion of right lower leg, initial encounter: Secondary | ICD-10-CM

## 2017-08-15 MED ORDER — MELOXICAM 15 MG PO TABS: 15.0000 mg | ORAL_TABLET | Freq: Every day | ORAL | 0 refills | Status: DC

## 2017-08-15 MED ORDER — VITAMIN D (ERGOCALCIFEROL) 1.25 MG (50000 UNIT) PO CAPS: 50000.0000 [IU] | ORAL_CAPSULE | ORAL | 0 refills | Status: DC

## 2017-08-15 NOTE — Assessment & Plan Note (Signed)
Recurrent and worsening contusion of the tibia and fibula of the lower extremity on the right side.  The vitamin D given again, we discussed icing regimen.  Patient continues to have difficulty with falling and we discussed the possibility of formal physical therapy for balance and conditioning which patient declined.  Discussed icing regimen.  Patient will follow up with me again in 3-4 weeks if not completely resolved.

## 2017-08-15 NOTE — Patient Instructions (Addendum)
Good to see you  Meloxicam daily for 10 days then as needed Ice 20 minutes 2 times daily. Usually after activity and before bed. Once weekly vitamin D for 12 weeks See me again in 3 weeks if not gone

## 2017-08-15 NOTE — Progress Notes (Signed)
[name] Prill D.O. [facility] [address] [place], [place] [address] Phone: [phone] Subjective:    I'm seeing this patient by the request  of:    CC: right knee pain   VHQ:[id number]:Subjective  Megan MunroeDeborah E Kihn is a 66 y.o. female coming in for right knee pain for 2 weeks. Her dog was going down the steps and got underneath patient's foot. Patient took a step to the side and stepped on a stone that moved. Her right leg stayed behind her as her body went forward. She was unable to walk the following day. She does have sharp pain with certain movements. She used ice and Aleve to reduce her pain.        Past Medical History:  Diagnosis Date  . Depression   . Thyroid disease    Hypothyroidism   Past Surgical History:  Procedure Laterality Date  . ABDOMINAL HYSTERECTOMY    . CARDIAC ELECTROPHYSIOLOGY MAPPING AND ABLATION    . CARPAL TUNNEL RELEASE     b/l   Social History   Socioeconomic History  . Marital status: Widowed    Spouse name: Not on file  . Number of children: Not on file  . Years of education: Not on file  . Highest education level: Not on file  Social Needs  . Financial resource strain: Not on file  . Food insecurity - worry: Not on file  . Food insecurity - inability: Not on file  . Transportation needs - medical: Not on file  . Transportation needs - non-medical: Not on file  Occupational History  . Not on file  Tobacco Use  . Smoking status: Former Smoker    Types: Cigarettes    Last attempt to quit: 02/11/2017    Years since quitting: 0.5  . Smokeless tobacco: Never Used  Substance and Sexual Activity  . Alcohol use: Yes  . Drug use: No  . Sexual activity: Not Currently    Partners: Male  Other Topics Concern  . Not on file  Social History Narrative  . Not on file   Allergies  Allergen Reactions  . Penicillins    Family History  Problem Relation Age of Onset  . Heart disease Mother        mvp  . Alzheimer's disease Father       Past medical history, social, surgical and family history all reviewed in electronic medical record.  No pertanent information unless stated regarding to the chief complaint.   Review of Systems:Review of systems updated and as accurate as of 08/15/17  No headache, visual changes, nausea, vomiting, diarrhea, constipation, dizziness, abdominal pain, skin rash, fevers, chills, night sweats, weight loss, swollen lymph nodes, body aches, joint swelling, muscle aches, chest pain, shortness of breath, mood changes.   Objective  There were no vitals taken for this visit. Systems examined below as of 08/15/17   General: No apparent distress alert and oriented x3 mood and affect normal, dressed appropriately.  HEENT: Pupils equal, extraocular movements intact  Respiratory: Patient's speak in full sentences and does not appear short of breath  Cardiovascular: No lower extremity edema, non tender, no erythema  Skin: Warm dry intact with no signs of infection or rash on extremities or on axial skeleton.  Abdomen: Soft nontender  Neuro: Cranial nerves II through XII are intact, neurovascularly intact in all extremities with 2+ DTRs and 2+ pulses.  Lymph: No lymphadenopathy of posterior or anterior cervical chain or axillae bilaterally.  Gait normal with  good balance and coordination.  MSK:  Non tender with full range of motion and good stability and symmetric strength and tone of shoulders, elbows, wrist, hip, and ankles bilaterally.  Right knee shows some very mild osteoarthritic changes.  Patient is still tender over the lateral again.  Patient has a very small contusion.  Full range of motion.  Negative McMurray's.  Full strength in the lower extremity.   Impression and Recommendations:     This case required medical decision making of moderate complexity.      Note: This dictation was prepared with Dragon dictation along with smaller phrase technology. Any transcriptional errors that  result from this process are unintentional.

## 2017-09-03 ENCOUNTER — Other Ambulatory Visit: Payer: Self-pay | Admitting: Nurse Practitioner

## 2017-09-03 DIAGNOSIS — E039 Hypothyroidism, unspecified: Secondary | ICD-10-CM

## 2017-10-09 IMAGING — DX DG KNEE COMPLETE 4+V*R*
4 series · 4 of 4 positions shown · non-contrast
Comparison: None

CLINICAL DATA: Injured 6 months ago with knot on the proximal
aspect of the right lower leg

EXAM:
RIGHT KNEE - COMPLETE 4+ VIEW

[knee ap]
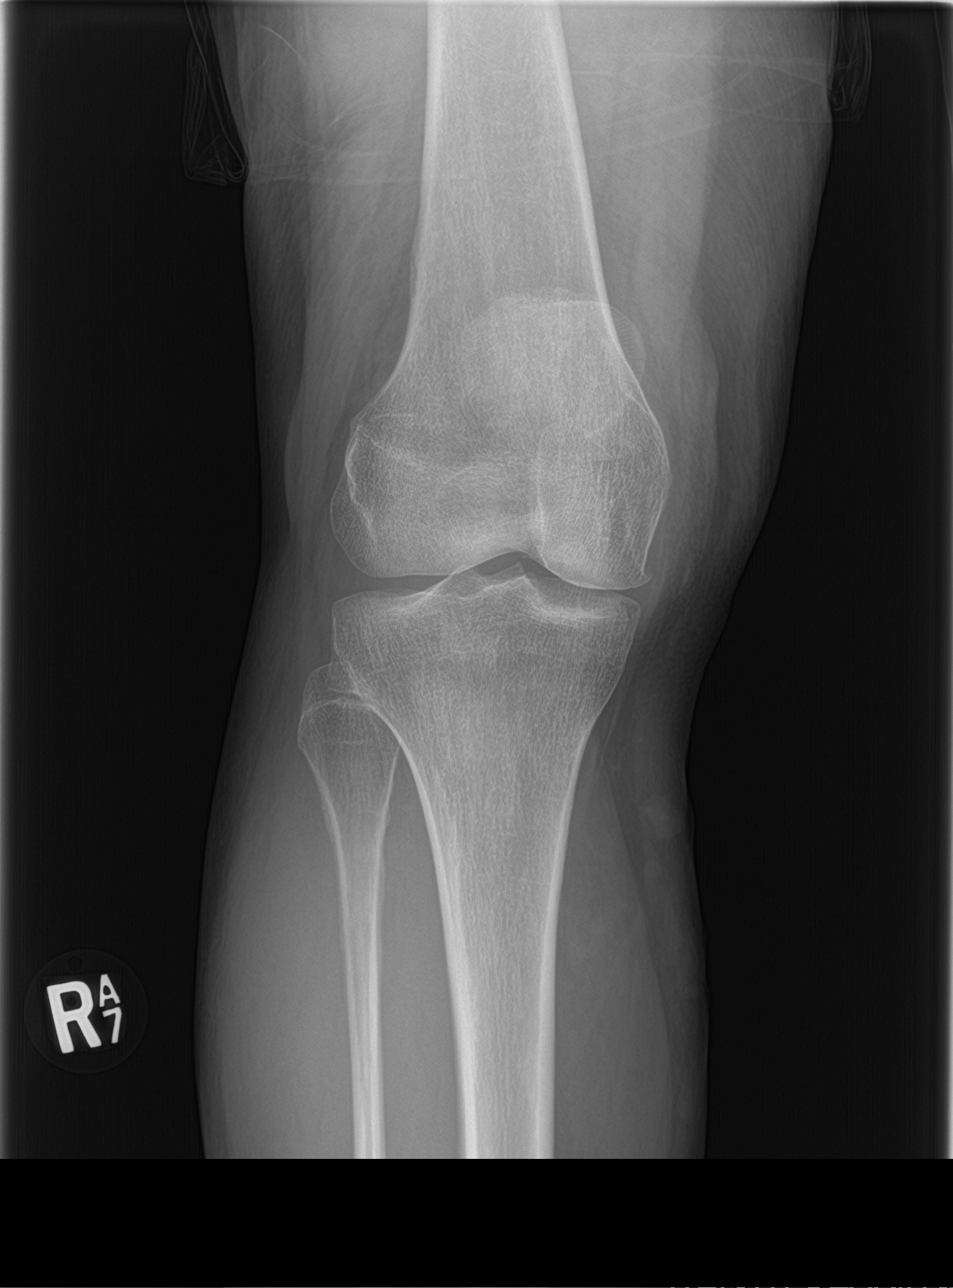

[knee tunnel]
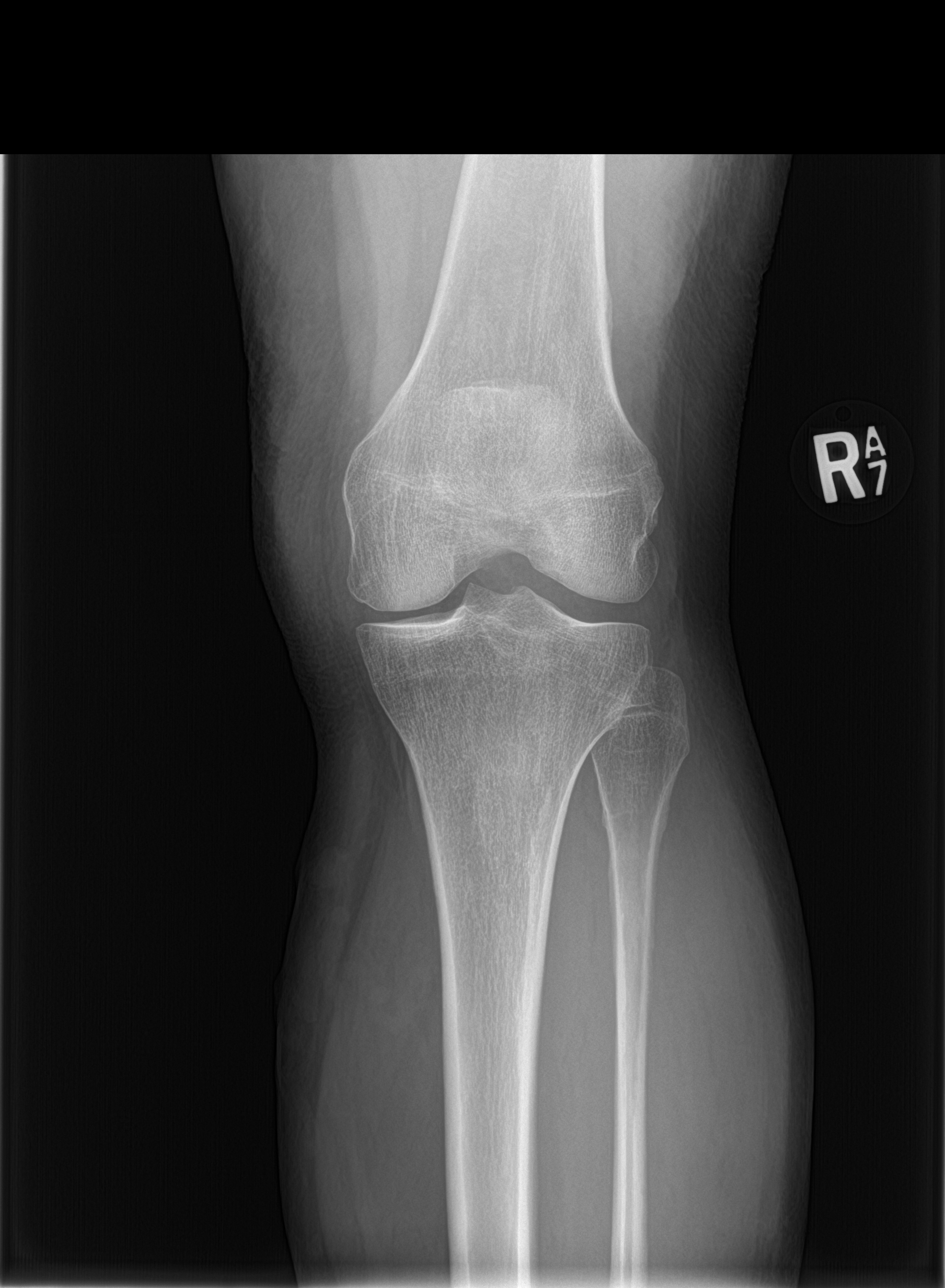

[knee lat]
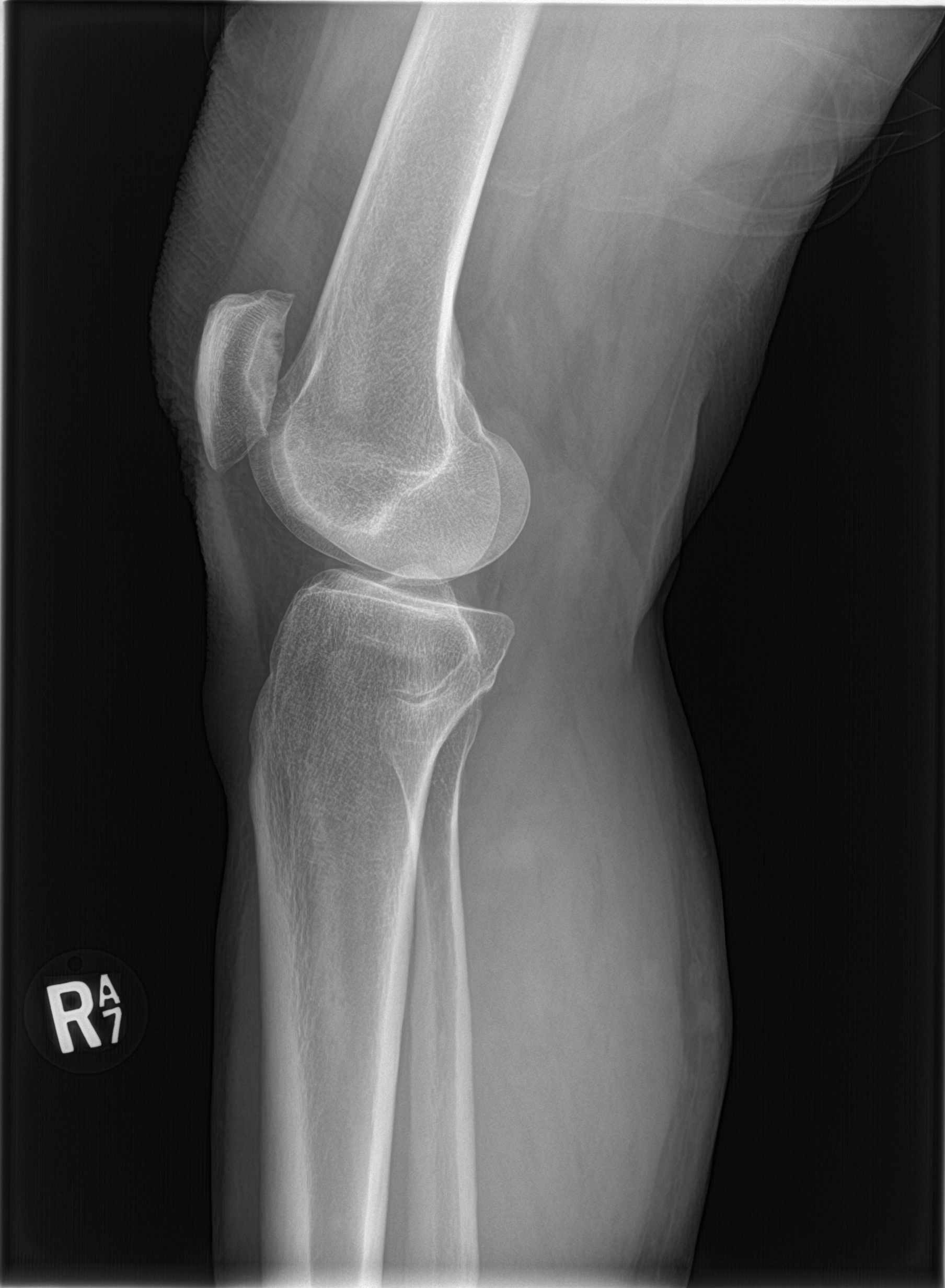

[sunrise]
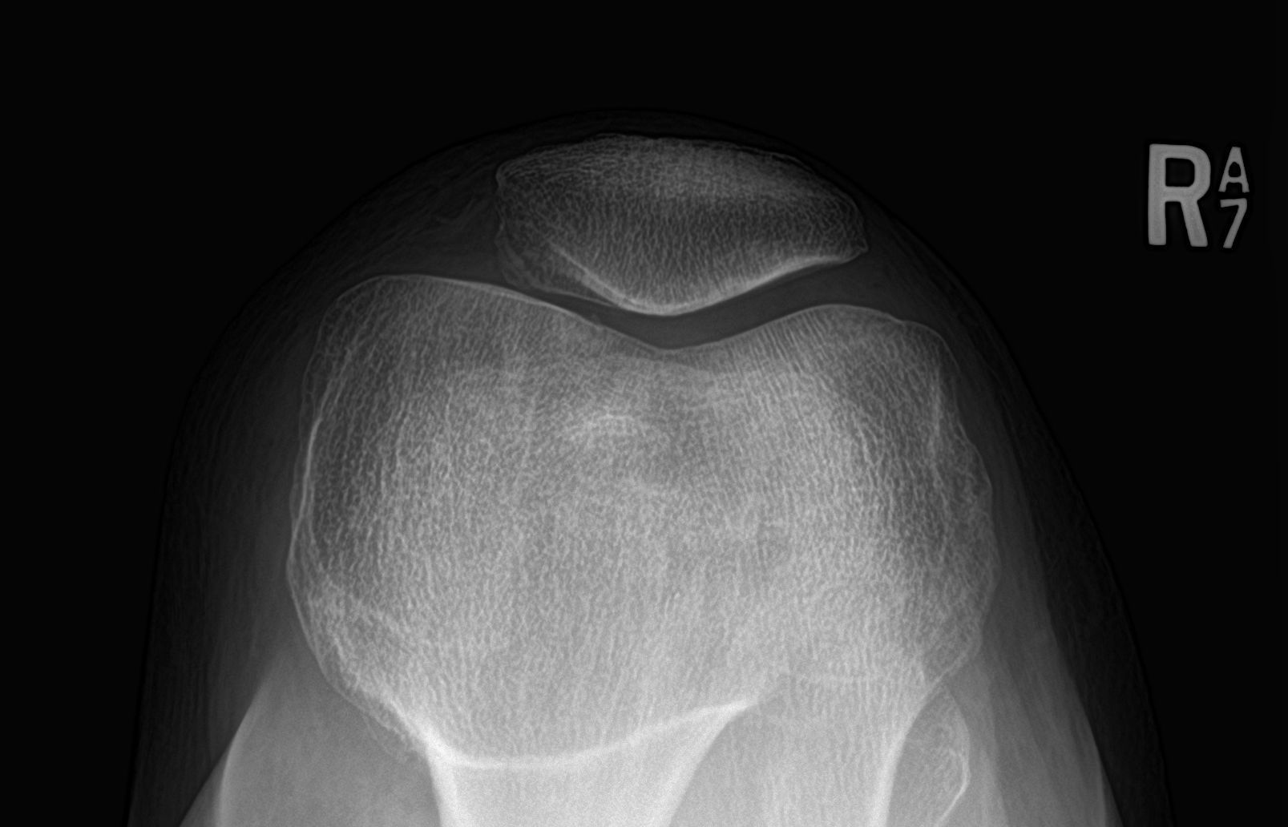

[4 of 4 positions shown; findings below may reference images not displayed]

FINDINGS: There is mild degenerative joint disease involving the right knee
primarily involving the medial compartment with there is slightly
more loss of joint space and spurring present. No acute fracture is
seen and no joint effusion is noted. On the lateral view there is
slight soft tissue swelling over the proximal right tibia anteriorly
which may be at the site of prior injury but no underlying bony
abnormality is seen.
IMPRESSION: Mild degenerative change particularly involving medial compartment.
No acute fracture.

## 2017-10-20 ENCOUNTER — Encounter: Payer: Self-pay | Admitting: Internal Medicine

## 2017-10-20 ENCOUNTER — Other Ambulatory Visit (INDEPENDENT_AMBULATORY_CARE_PROVIDER_SITE_OTHER): Payer: PPO

## 2017-10-20 ENCOUNTER — Ambulatory Visit (INDEPENDENT_AMBULATORY_CARE_PROVIDER_SITE_OTHER): Payer: PPO | Admitting: Internal Medicine

## 2017-10-20 VITALS — BP 120/68 | HR 79 | Temp 98.1°F | Resp 16 | Ht 67.5 in | Wt 156.8 lb

## 2017-10-20 DIAGNOSIS — R5383 Other fatigue: Secondary | ICD-10-CM

## 2017-10-20 DIAGNOSIS — E039 Hypothyroidism, unspecified: Secondary | ICD-10-CM | POA: Diagnosis not present

## 2017-10-20 LAB — CBC
HCT: 36.8 % (ref 36.0–46.0)
Hemoglobin: 12.3 g/dL (ref 12.0–15.0)
MCHC: 33.5 g/dL (ref 30.0–36.0)
MCV: 95.9 fl (ref 78.0–100.0)
PLATELETS: 200 10*3/uL (ref 150.0–400.0)
RBC: 3.84 Mil/uL — ABNORMAL LOW (ref 3.87–5.11)
RDW: 12.8 % (ref 11.5–15.5)
WBC: 6.3 10*3/uL (ref 4.0–10.5)

## 2017-10-20 LAB — COMPREHENSIVE METABOLIC PANEL
ALK PHOS: 36 U/L — AB (ref 39–117)
ALT: 14 U/L (ref 0–35)
AST: 15 U/L (ref 0–37)
Albumin: 4.3 g/dL (ref 3.5–5.2)
BILIRUBIN TOTAL: 0.7 mg/dL (ref 0.2–1.2)
BUN: 29 mg/dL — ABNORMAL HIGH (ref 6–23)
CO2: 28 mEq/L (ref 19–32)
CREATININE: 1.02 mg/dL (ref 0.40–1.20)
Calcium: 9.1 mg/dL (ref 8.4–10.5)
Chloride: 106 mEq/L (ref 96–112)
GFR: 57.43 mL/min — AB (ref >60.00)
GLUCOSE: 126 mg/dL — AB (ref 70–99)
Potassium: 4 mEq/L (ref 3.5–5.1)
Sodium: 141 mEq/L (ref 135–145)
TOTAL PROTEIN: 6.5 g/dL (ref 6.0–8.3)

## 2017-10-20 LAB — VITAMIN B12: Vitamin B-12: 174 pg/mL — ABNORMAL LOW (ref 211–911)

## 2017-10-20 LAB — T4, FREE: FREE T4: 0.93 ng/dL (ref 0.60–1.60)

## 2017-10-20 LAB — VITAMIN D 25 HYDROXY (VIT D DEFICIENCY, FRACTURES): VITD: 20.92 ng/mL — AB (ref 30.00–100.00)

## 2017-10-20 LAB — TSH: TSH: 1.01 u[IU]/mL (ref 0.35–4.50)

## 2017-10-20 NOTE — Progress Notes (Signed)
   Subjective:    Patient ID: Megan Romero, female    DOB: Mar 05, 1951, 67 y.o.   MRN: [id number]  HPI The patient is a 67 YO female coming in for concerns about weight gain in the last year. She is worried about hand numbness as well. She is worried about her thyroid. She is on thyroid medication and takes daily faithfully. She is getting cold more of the time and her skin is dry. She feels like when she stopped smoking this changed.   Review of Systems  Constitutional: Positive for fatigue and unexpected weight change. Negative for activity change, appetite change and chills.  HENT: Negative.   Eyes: Negative.   Respiratory: Negative for cough, chest tightness and shortness of breath.   Cardiovascular: Negative for chest pain, palpitations and leg swelling.  Gastrointestinal: Negative for abdominal distention, abdominal pain, constipation, diarrhea, nausea and vomiting.  Endocrine: Positive for cold intolerance.  Musculoskeletal: Negative.   Skin: Negative.   Neurological: Negative.   Psychiatric/Behavioral: Negative.       Objective:   Physical Exam  Constitutional: She is oriented to person, place, and time. She appears well-developed and well-nourished.  HENT:  Head: Normocephalic and atraumatic.  Eyes: EOM are normal.  Neck: Normal range of motion.  Cardiovascular: Normal rate and regular rhythm.  Pulmonary/Chest: Effort normal and breath sounds normal. No respiratory distress. She has no wheezes. She has no rales.  Abdominal: Soft. Bowel sounds are normal. She exhibits no distension. There is no tenderness. There is no rebound.  Musculoskeletal: She exhibits no edema.  Neurological: She is alert and oriented to person, place, and time. Coordination normal.  Skin: Skin is warm and dry.  Psychiatric: She has a normal mood and affect.   Vitals:   10/20/17 1359  BP: 120/68  Pulse: 79  Resp: 16  Temp: 98.1 F (36.7 C)  TempSrc: Oral  SpO2: 97%  Weight: 156 lb 12.8  oz (71.1 kg)  Height: 5' 7.5" (1.715 m)      Assessment & Plan:

## 2017-10-20 NOTE — Assessment & Plan Note (Signed)
Checking TSH and free T4 and adjust as needed for normal range. Taking synthroid 125 mcg daily. Also checking B12, vitamin D, CBC, CMP for numbness and tingling.

## 2017-10-20 NOTE — Patient Instructions (Signed)
We will check the levels and adjust the thyroid medication.

## 2017-10-21 ENCOUNTER — Other Ambulatory Visit: Payer: Self-pay | Admitting: Internal Medicine

## 2017-10-21 DIAGNOSIS — E039 Hypothyroidism, unspecified: Secondary | ICD-10-CM

## 2017-10-21 MED ORDER — LEVOTHYROXINE SODIUM 137 MCG PO TABS: ORAL_TABLET | ORAL | 1 refills | Status: DC

## 2017-10-21 MED ORDER — VITAMIN B-12 1000 MCG PO TABS: 1000.0000 ug | ORAL_TABLET | Freq: Every day | ORAL | 3 refills | Status: DC

## 2017-11-04 DIAGNOSIS — G5603 Carpal tunnel syndrome, bilateral upper limbs: Secondary | ICD-10-CM | POA: Diagnosis not present

## 2017-11-12 NOTE — Progress Notes (Signed)
Megan Romero [address] [place], [place] [address] Phone: [phone] Subjective:    I'm seeing this patient by the request  of:    CC: Wrist pain  GNF:[id number]  Megan Romero is a 67 y.o. female coming in with complaint of bilateral wrist pain. Left is worse. Has had surgery on both hands. Recently got a puppy & it pulls on the leash. Can't make a fist with left hand in the morning. Grip is weak. Numbness and tingling. Switches hands while she drives. Hands are stiff in the morning.  Onset- 2 months Location- weak Duration- Worse in the morning Character-left greater than right but more of a dull aching sensation with a burning aftermath Aggravating factors- Puppy Reliving factors- Ice, aleve Therapies tried-  Severity-9 out of 10 recently found to have a B12 deficiency.  History of alcohol abuse.     Past Medical History:  Diagnosis Date  . Depression   . Thyroid disease    Hypothyroidism   Past Surgical History:  Procedure Laterality Date  . ABDOMINAL HYSTERECTOMY    . CARDIAC ELECTROPHYSIOLOGY MAPPING AND ABLATION    . CARPAL TUNNEL RELEASE     b/l   Social History   Socioeconomic History  . Marital status: Widowed    Spouse name: Not on file  . Number of children: Not on file  . Years of education: Not on file  . Highest education level: Not on file  Social Needs  . Financial resource strain: Not on file  . Food insecurity - worry: Not on file  . Food insecurity - inability: Not on file  . Transportation needs - medical: Not on file  . Transportation needs - non-medical: Not on file  Occupational History  . Not on file  Tobacco Use  . Smoking status: Former Smoker    Types: Cigarettes    Last attempt to quit: 02/11/2017    Years since quitting: 0.7  . Smokeless tobacco: Never Used  Substance and Sexual Activity  . Alcohol use: Yes  . Drug use: No  . Sexual activity: Not Currently    Partners: Male  Other Topics  Concern  . Not on file  Social History Narrative  . Not on file   Allergies  Allergen Reactions  . Penicillins    Family History  Problem Relation Age of Onset  . Heart disease Mother        mvp  . Alzheimer's disease Father      Past medical history, social, surgical and family history all reviewed in electronic medical record.  No pertanent information unless stated regarding to the chief complaint.   Review of Systems:Review of systems updated and as accurate as of 11/12/17  No headache, visual changes, nausea, vomiting, diarrhea, constipation, dizziness, abdominal pain, skin rash, fevers, chills, night sweats, weight loss, swollen lymph nodes, body aches, joint swelling,  chest pain, shortness of breath, mood changes.  Positive muscle aches  Objective  There were no vitals taken for this visit. Systems examined below as of 11/12/17   General: No apparent distress alert and oriented x3 mood and affect normal, dressed appropriately.  HEENT: Pupils equal, extraocular movements intact  Respiratory: Patient's speak in full sentences and does not appear short of breath  Cardiovascular: No lower extremity edema, non tender, no erythema  Skin: Warm dry intact with no signs of infection or rash on extremities or on axial skeleton.  Abdomen: Soft nontender  Neuro: Cranial nerves II  through XII are intact, neurovascularly intact in all extremities with 2+ DTRs and 2+ pulses.  Lymph: No lymphadenopathy of posterior or anterior cervical chain or axillae bilaterally.  Gait normal with good balance and coordination.  MSK:  Non tender with full range of motion and good stability and symmetric strength and tone of shoulders, elbows, hip, knee and ankles bilaterally.  Wrist: Bilateral Inspection normal with no visible erythema or swelling. ROM smooth and normal with good flexion and extension and ulnar/radial deviation that is symmetrical with opposite wrist. Palpation is normal over  metacarpals, navicular, lunate, and TFCC; tendons without tenderness/ swelling No snuffbox tenderness. No tenderness over Canal of Guyon. Strength 5/5 in all directions without pain. Negative Finkelstein, positive  Tinel's and phalens. Negative Watson's test.  MSK US performed of: Bilateral wrist This study was ordered, performed, and interpreted by Terrilee FilesZach Coda Filler D.O.  Wrist: Postsurgical changes over the median nerve bilaterally.  Right median nerve is within deviation of normal area.  Significant swelling and enlargement of the left median nerve noted.  Mild osteoarthritic changes of the hands bilaterally.  IMPRESSION: Left carpal tunnel syndrome  Procedure: Real-time Ultrasound Guided Injection of left carpal tunnel Device: GE Logiq Q7 Ultrasound guided injection is preferred based studies that show increased duration, increased effect, greater accuracy, decreased procedural pain, increased response rate with ultrasound guided versus blind injection.  Verbal informed consent obtained.  Time-out conducted.  Noted no overlying erythema, induration, or other signs of local infection.  Skin prepped in a sterile fashion.  Local anesthesia: Topical Ethyl chloride.  With sterile technique and under real time ultrasound guidance:  median nerve visualized.  23g 5/8 inch needle inserted distal to proximal approach into nerve sheath. Pictures taken nfor needle placement. Patient did have injection of 2 cc of 0.5% Marcaine, and 1 cc of Kenalog 40 mg/dL. Completed without difficulty  Pain immediately resolved suggesting accurate placement of the medication.  Advised to call if fevers/chills, erythema, induration, drainage, or persistent bleeding.  Images permanently stored and available for review in the ultrasound unit.  Impression: Technically successful ultrasound guided injection.    Impression and Recommendations:     This case required medical decision making of moderate complexity.        Note: This dictation was prepared with Dragon dictation along with smaller phrase technology. Any transcriptional errors that result from this process are unintentional.

## 2017-11-14 ENCOUNTER — Encounter: Payer: Self-pay | Admitting: Family Medicine

## 2017-11-14 ENCOUNTER — Ambulatory Visit: Payer: Self-pay

## 2017-11-14 ENCOUNTER — Ambulatory Visit: Payer: PPO | Admitting: Family Medicine

## 2017-11-14 VITALS — BP 92/60 | HR 73 | Ht 67.0 in | Wt 158.0 lb

## 2017-11-14 DIAGNOSIS — M25531 Pain in right wrist: Secondary | ICD-10-CM | POA: Diagnosis not present

## 2017-11-14 DIAGNOSIS — G5602 Carpal tunnel syndrome, left upper limb: Secondary | ICD-10-CM | POA: Diagnosis not present

## 2017-11-14 DIAGNOSIS — M25532 Pain in left wrist: Secondary | ICD-10-CM | POA: Diagnosis not present

## 2017-11-14 NOTE — Assessment & Plan Note (Signed)
Patient given injection and tolerated the procedure well.  We discussed icing regimen and home exercises, discussed which activities of doing which wants to avoid.  Increase activity slowly over the course the next several days.  Follow-up with me again 4-6 weeks.  Brace given today for nighttime wearing.  Has done home exercises before.  Has had surgery.

## 2017-11-14 NOTE — Patient Instructions (Signed)
Good to see you  Megan Romero is your friend.  Consider the brace at night B6 200mg  daily with your B12  Have them recheck B12 again in 2 months and if still low you will need injections See me again in 4 week s

## 2018-01-22 ENCOUNTER — Encounter: Payer: Self-pay | Admitting: Internal Medicine

## 2018-01-23 ENCOUNTER — Other Ambulatory Visit (INDEPENDENT_AMBULATORY_CARE_PROVIDER_SITE_OTHER): Payer: PPO

## 2018-01-23 DIAGNOSIS — E039 Hypothyroidism, unspecified: Secondary | ICD-10-CM | POA: Diagnosis not present

## 2018-01-23 LAB — T4, FREE: FREE T4: 1.13 ng/dL (ref 0.60–1.60)

## 2018-01-23 LAB — TSH: TSH: 0.7 u[IU]/mL (ref 0.35–4.50)

## 2018-01-27 DIAGNOSIS — H01009 Unspecified blepharitis unspecified eye, unspecified eyelid: Secondary | ICD-10-CM | POA: Diagnosis not present

## 2018-01-27 DIAGNOSIS — H00025 Hordeolum internum left lower eyelid: Secondary | ICD-10-CM | POA: Diagnosis not present

## 2018-01-27 DIAGNOSIS — H0011 Chalazion right upper eyelid: Secondary | ICD-10-CM | POA: Diagnosis not present

## 2018-01-30 ENCOUNTER — Telehealth: Payer: Self-pay | Admitting: Emergency Medicine

## 2018-01-30 NOTE — Telephone Encounter (Signed)
Called patient to schedule AWV. Patient declined at this time. 

## 2018-02-06 DIAGNOSIS — H00025 Hordeolum internum left lower eyelid: Secondary | ICD-10-CM | POA: Diagnosis not present

## 2018-02-06 DIAGNOSIS — H00022 Hordeolum internum right lower eyelid: Secondary | ICD-10-CM | POA: Diagnosis not present

## 2018-02-06 DIAGNOSIS — H00021 Hordeolum internum right upper eyelid: Secondary | ICD-10-CM | POA: Diagnosis not present

## 2018-02-13 DIAGNOSIS — H00025 Hordeolum internum left lower eyelid: Secondary | ICD-10-CM | POA: Diagnosis not present

## 2018-02-13 DIAGNOSIS — H00021 Hordeolum internum right upper eyelid: Secondary | ICD-10-CM | POA: Diagnosis not present

## 2018-02-13 DIAGNOSIS — H00022 Hordeolum internum right lower eyelid: Secondary | ICD-10-CM | POA: Diagnosis not present

## 2018-02-27 DIAGNOSIS — H00025 Hordeolum internum left lower eyelid: Secondary | ICD-10-CM | POA: Diagnosis not present

## 2018-02-27 DIAGNOSIS — H00022 Hordeolum internum right lower eyelid: Secondary | ICD-10-CM | POA: Diagnosis not present

## 2018-02-27 DIAGNOSIS — H00021 Hordeolum internum right upper eyelid: Secondary | ICD-10-CM | POA: Diagnosis not present

## 2018-03-10 DIAGNOSIS — H00025 Hordeolum internum left lower eyelid: Secondary | ICD-10-CM | POA: Diagnosis not present

## 2018-03-10 DIAGNOSIS — H33321 Round hole, right eye: Secondary | ICD-10-CM | POA: Diagnosis not present

## 2018-03-10 DIAGNOSIS — H35033 Hypertensive retinopathy, bilateral: Secondary | ICD-10-CM | POA: Diagnosis not present

## 2018-04-28 ENCOUNTER — Other Ambulatory Visit: Payer: Self-pay | Admitting: Internal Medicine

## 2018-04-28 DIAGNOSIS — E039 Hypothyroidism, unspecified: Secondary | ICD-10-CM

## 2018-07-01 ENCOUNTER — Ambulatory Visit (INDEPENDENT_AMBULATORY_CARE_PROVIDER_SITE_OTHER): Payer: PPO

## 2018-07-01 DIAGNOSIS — Z23 Encounter for immunization: Secondary | ICD-10-CM

## 2018-07-18 ENCOUNTER — Other Ambulatory Visit: Payer: Self-pay | Admitting: Internal Medicine

## 2018-07-18 DIAGNOSIS — E039 Hypothyroidism, unspecified: Secondary | ICD-10-CM

## 2018-09-18 ENCOUNTER — Encounter: Payer: Self-pay | Admitting: Family

## 2018-09-18 ENCOUNTER — Telehealth: Payer: Self-pay

## 2018-09-18 ENCOUNTER — Ambulatory Visit (INDEPENDENT_AMBULATORY_CARE_PROVIDER_SITE_OTHER): Payer: PPO | Admitting: Family

## 2018-09-18 VITALS — BP 106/80 | HR 72 | Temp 98.0°F | Ht 67.0 in | Wt 160.1 lb

## 2018-09-18 DIAGNOSIS — H6121 Impacted cerumen, right ear: Secondary | ICD-10-CM | POA: Diagnosis not present

## 2018-09-18 NOTE — Progress Notes (Signed)
Megan MunroeDeborah E Lehner is a 68 y.o. female with the following history as recorded in EpicCare:  Patient Active Problem List   Diagnosis Date Noted  . Carpal tunnel syndrome on left 11/14/2017  . Encounter for smoking cessation counseling 05/23/2017  . Contusion of right tibia 01/16/2017  . Synovitis of toe 10/01/2016  . Loss of transverse plantar arch of right foot 10/01/2016  . Hearing loss due to cerumen impaction, right 09/27/2016  . Routine general medical examination at a health care facility 10/09/2015  . Hypothyroidism 05/26/2008    Current Outpatient Medications  Medication Sig Dispense Refill  . aspirin 81 MG tablet Take 81 mg by mouth daily.      Marland Kitchen. levothyroxine (SYNTHROID, LEVOTHROID) 137 MCG tablet TAKE 1 TABLET ONCE DAILY BEFORE BREAKFAST. 90 tablet 0  . acetaminophen (TYLENOL) 500 MG tablet Take 500 mg by mouth every 6 (six) hours as needed.    . meloxicam (MOBIC) 15 MG tablet Take 1 tablet (15 mg total) by mouth daily. (Patient not taking: Reported on 09/18/2018) 30 tablet 0  . vitamin B-12 (CYANOCOBALAMIN) 1000 MCG tablet Take 1 tablet (1,000 mcg total) by mouth daily. (Patient not taking: Reported on 09/18/2018) 90 tablet 3   No current facility-administered medications for this visit.     Allergies: Penicillins  Past Medical History:  Diagnosis Date  . Depression   . Thyroid disease    Hypothyroidism    Past Surgical History:  Procedure Laterality Date  . ABDOMINAL HYSTERECTOMY    . CARDIAC ELECTROPHYSIOLOGY MAPPING AND ABLATION    . CARPAL TUNNEL RELEASE     b/l    Family History  Problem Relation Age of Onset  . Heart disease Mother        mvp  . Alzheimer's disease Father     Social History   Tobacco Use  . Smoking status: Former Smoker    Types: Cigarettes    Last attempt to quit: 02/11/2017    Years since quitting: 1.6  . Smokeless tobacco: Never Used  Substance Use Topics  . Alcohol use: Yes    Subjective:  Sudden onset of decreased hearing in  right ear; has had to have ears flushed in the past; symptoms started yesterday; no recent air travel or swimming; denies any pain;    Objective:  Vitals:   09/18/18 1130  BP: 106/80  Pulse: 72  Temp: 98 F (36.7 C)  TempSrc: Oral  SpO2: 95%  Weight: 160 lb 1.3 oz (72.6 kg)  Height: 5\' 7"  (1.702 m)    General: Well developed, well nourished, in no acute distress  Skin : Warm and dry.  Head: Normocephalic and atraumatic  Eyes: Sclera and conjunctiva clear; pupils round and reactive to light; extraocular movements intact  Ears:  right ear canal impacted with copious hard wax, examination post ear lavage canal is clear and no bleeding or complications noted. Oropharynx: Pink, supple. No suspicious lesions  Neck: Supple without thyromegaly, adenopathy  Lungs: Respirations unlabored; clear to auscultation bilaterally without wheeze, rales, rhonchi  CVS exam: normal rate and regular rhythm.  Neurologic: Alert and oriented; speech intact; face symmetrical; moves all extremities well; CNII-XII intact without focal deficit   Assessment:  1. Impacted cerumen of right ear     Plan:  Ear lavage completed with no difficulty/ complications; Follow up with her PCP as needed otherwise.   No follow-ups on file.  No orders of the defined types were placed in this encounter.   Requested Prescriptions  No prescriptions requested or ordered in this encounter

## 2018-09-18 NOTE — Progress Notes (Signed)
Patient consent obtained. Irrigation with water and peroxide performed. Full view of tympanic membranes of right ear after procedure.  Patient tolerated procedure well.

## 2018-09-18 NOTE — Telephone Encounter (Signed)
Patient consent obtained. Irrigation with water and peroxide performed. Full view of tympanic membranes after procedure.  Patient tolerated procedure well.   

## 2018-11-03 ENCOUNTER — Other Ambulatory Visit: Payer: Self-pay | Admitting: Internal Medicine

## 2018-11-03 DIAGNOSIS — E039 Hypothyroidism, unspecified: Secondary | ICD-10-CM

## 2019-01-28 DIAGNOSIS — H0102A Squamous blepharitis right eye, upper and lower eyelids: Secondary | ICD-10-CM | POA: Diagnosis not present

## 2019-01-28 DIAGNOSIS — H0288B Meibomian gland dysfunction left eye, upper and lower eyelids: Secondary | ICD-10-CM | POA: Diagnosis not present

## 2019-01-28 DIAGNOSIS — H00014 Hordeolum externum left upper eyelid: Secondary | ICD-10-CM | POA: Diagnosis not present

## 2019-01-28 DIAGNOSIS — H0288A Meibomian gland dysfunction right eye, upper and lower eyelids: Secondary | ICD-10-CM | POA: Diagnosis not present

## 2019-02-09 ENCOUNTER — Other Ambulatory Visit: Payer: Self-pay | Admitting: Internal Medicine

## 2019-02-09 DIAGNOSIS — E039 Hypothyroidism, unspecified: Secondary | ICD-10-CM

## 2019-03-15 ENCOUNTER — Telehealth: Payer: Self-pay

## 2019-03-15 NOTE — Telephone Encounter (Signed)
Spoke with patient. First appointment is in August. Patient declines appointment but will call if her knee pain gets worse. Recommended ice and Tylenol which patient is already using.

## 2019-03-22 ENCOUNTER — Telehealth: Payer: Self-pay | Admitting: Internal Medicine

## 2019-03-22 DIAGNOSIS — E039 Hypothyroidism, unspecified: Secondary | ICD-10-CM

## 2019-03-22 NOTE — Telephone Encounter (Signed)
Medication Refill - Medication: levothyroxine (SYNTHROID) 137 MCG tablet [[id number]]    Has the patient contacted their pharmacy? No. (Agent: If no, request that the patient contact the pharmacy for the refill.) (Agent: If yes, when and what did the pharmacy advise?)  Preferred Pharmacy (with phone number or street name):  [facility], [place]. [phone] (Phone) [phone] (Fax)     Agent: Please be advised that RX refills may take up to 3 business days. We ask that you follow-up with your pharmacy.

## 2019-03-23 MED ORDER — LEVOTHYROXINE SODIUM 137 MCG PO TABS: 137.0000 ug | ORAL_TABLET | Freq: Every day | ORAL | 0 refills | Status: DC

## 2019-03-23 NOTE — Telephone Encounter (Signed)
Left pt vm to schedule.

## 2019-03-23 NOTE — Telephone Encounter (Signed)
Pt is overdue for annual appt w/labs. Will send 30 day only, but need to be schedule for annual appt.Marland KitchenJohny Romero

## 2019-04-01 ENCOUNTER — Other Ambulatory Visit: Payer: Self-pay

## 2019-04-26 ENCOUNTER — Ambulatory Visit (INDEPENDENT_AMBULATORY_CARE_PROVIDER_SITE_OTHER): Payer: PPO | Admitting: Internal Medicine

## 2019-04-26 DIAGNOSIS — E538 Deficiency of other specified B group vitamins: Secondary | ICD-10-CM

## 2019-04-26 DIAGNOSIS — Z13220 Encounter for screening for lipoid disorders: Secondary | ICD-10-CM | POA: Diagnosis not present

## 2019-04-26 DIAGNOSIS — R635 Abnormal weight gain: Secondary | ICD-10-CM

## 2019-04-26 DIAGNOSIS — E039 Hypothyroidism, unspecified: Secondary | ICD-10-CM | POA: Diagnosis not present

## 2019-04-26 DIAGNOSIS — Z0000 Encounter for general adult medical examination without abnormal findings: Secondary | ICD-10-CM

## 2019-04-26 NOTE — Progress Notes (Signed)
Virtual Visit via Video Note  I connected with Megan Romero on 04/26/19 at  3:20 PM EDT by a video enabled telemedicine application and verified that I am speaking with the correct person using two identifiers.  The patient and the provider were at separate locations throughout the entire encounter.   I discussed the limitations of evaluation and management by telemedicine and the availability of in person appointments. The patient expressed understanding and agreed to proceed.  History of Present Illness: The patient is a 68 y.o. female with visit for follow up thyroid (taking synthroid 137 mcg daily, denies heat or cold intolerance, does have weight gain despite trying to lose weight, started with smoking cessation and has continued, denies missing doses of medication) and B12 deficiency (was low last year, is not taking oral at this time, did take for awhile, denies numbness in feet or hands), and weight gain (she stopped smoking cigarettes, weight started to increase, since that time she has gone up and up, she has tried to work on eating, she is very active and lots of exercise, denies missing thyroid medications).   Observations/Objective: Appearance: normal, breathing appears normal, casual grooming, abdomen does not appear distended, throat normal, memory normal, mental status is A and O times 3  Assessment and Plan: See problem oriented charting  Follow Up Instructions: labs, adjust meds as needed  I discussed the assessment and treatment plan with the patient. The patient was provided an opportunity to ask questions and all were answered. The patient agreed with the plan and demonstrated an understanding of the instructions.   The patient was advised to call back or seek an in-person evaluation if the symptoms worsen or if the condition fails to improve as anticipated.  Hoyt Koch, MD

## 2019-04-27 ENCOUNTER — Encounter: Payer: Self-pay | Admitting: Internal Medicine

## 2019-04-27 MED ORDER — LEVOTHYROXINE SODIUM 137 MCG PO TABS
137.0000 ug | ORAL_TABLET | Freq: Every day | ORAL | 3 refills | Status: DC
Start: 2019-04-27 — End: 2020-04-25

## 2019-04-28 ENCOUNTER — Encounter: Payer: Self-pay | Admitting: Internal Medicine

## 2019-04-28 DIAGNOSIS — E538 Deficiency of other specified B group vitamins: Secondary | ICD-10-CM | POA: Insufficient documentation

## 2019-04-28 DIAGNOSIS — R635 Abnormal weight gain: Secondary | ICD-10-CM | POA: Insufficient documentation

## 2019-04-28 HISTORY — DX: Deficiency of other specified B group vitamins: E53.8

## 2019-04-28 NOTE — Assessment & Plan Note (Signed)
We talked about how smoking cessation causes more taste bud regeneration and this can cause people to eat different or higher quantities. Checking labs for metabolic cause of weight gain. Work on portions by using smaller plate.

## 2019-04-28 NOTE — Assessment & Plan Note (Signed)
Checking B12 level, not on oral replacement at this time.

## 2019-04-28 NOTE — Assessment & Plan Note (Signed)
Checking labs today including TSH and free T4, adjust synthroid 137 mcg daily as needed.

## 2019-07-22 ENCOUNTER — Other Ambulatory Visit: Payer: Self-pay

## 2019-07-22 ENCOUNTER — Ambulatory Visit (INDEPENDENT_AMBULATORY_CARE_PROVIDER_SITE_OTHER): Payer: PPO

## 2019-07-22 DIAGNOSIS — Z23 Encounter for immunization: Secondary | ICD-10-CM

## 2019-07-22 NOTE — Addendum Note (Signed)
Addended by: Earnstine Regal on: 07/22/2019 03:58 PM   Modules accepted: Orders

## 2019-07-23 ENCOUNTER — Telehealth: Payer: Self-pay | Admitting: *Deleted

## 2019-07-23 NOTE — Telephone Encounter (Signed)
Notified pt concerning the ShingRx she received on yesterday. Inform her due top her insurance she will have to get second injection done at her pharmacy. Inform pt around 1st of February to call MD to have shinglerx rx sentto her pharmacy.Marland KitchenJohny Chess

## 2019-10-11 DIAGNOSIS — L820 Inflamed seborrheic keratosis: Secondary | ICD-10-CM | POA: Diagnosis not present

## 2019-10-11 DIAGNOSIS — Z411 Encounter for cosmetic surgery: Secondary | ICD-10-CM | POA: Diagnosis not present

## 2019-10-21 ENCOUNTER — Ambulatory Visit: Payer: PPO

## 2019-11-12 ENCOUNTER — Encounter: Payer: Self-pay | Admitting: Internal Medicine

## 2019-11-12 ENCOUNTER — Ambulatory Visit (INDEPENDENT_AMBULATORY_CARE_PROVIDER_SITE_OTHER): Payer: PPO | Admitting: Internal Medicine

## 2019-11-12 ENCOUNTER — Other Ambulatory Visit: Payer: Self-pay

## 2019-11-12 DIAGNOSIS — H6121 Impacted cerumen, right ear: Secondary | ICD-10-CM

## 2019-11-12 NOTE — Progress Notes (Signed)
Patient consent obtained. Irrigation with water and peroxide performed. Full view of tympanic membranes after procedure.  Patient tolerated procedure well.   

## 2019-11-12 NOTE — Progress Notes (Signed)
   Subjective:   Patient ID: Megan Romero, female    DOB: [birth year], 69 y.o.   MRN: [id number]  HPI The patient is a 69 YO female coming in for concerns about ear blockage in right ear. She does not use q-tips or anything in it. Has hearing loss over the last week. Previously had wax blocking left ear and this feels similar. Denies pain in sinuses or drainage. Denies fevers or chills. Denies SOB.   Review of Systems  Constitutional: Negative.   HENT: Positive for hearing loss.   Eyes: Negative.   Respiratory: Negative for cough, chest tightness and shortness of breath.   Cardiovascular: Negative for chest pain, palpitations and leg swelling.  Gastrointestinal: Negative for abdominal distention, abdominal pain, constipation, diarrhea, nausea and vomiting.  Musculoskeletal: Negative.   Skin: Negative.   Neurological: Negative.   Psychiatric/Behavioral: Negative.     Objective:  Physical Exam Constitutional:      Appearance: She is well-developed.  HENT:     Head: Normocephalic and atraumatic.     Comments:  right ear canal impacted with copious hard wax, examination post ear lavage canal is clear and no bleeding or complications noted. TM normal after disimpaction.     Right Ear: Tympanic membrane normal.     Left Ear: Tympanic membrane normal.  Cardiovascular:     Rate and Rhythm: Normal rate and regular rhythm.  Pulmonary:     Effort: Pulmonary effort is normal. No respiratory distress.     Breath sounds: Normal breath sounds. No wheezing or rales.  Abdominal:     General: Bowel sounds are normal. There is no distension.     Palpations: Abdomen is soft.     Tenderness: There is no abdominal tenderness. There is no rebound.  Musculoskeletal:     Cervical back: Normal range of motion.  Skin:    General: Skin is warm and dry.  Neurological:     Mental Status: She is alert and oriented to person, place, and time.     Coordination: Coordination normal.     Vitals:   11/12/19 1541  BP: 114/78  Pulse: 87  Temp: 98.6 F (37 C)  TempSrc: Oral  SpO2: 95%  Weight: 178 lb 4 oz (80.9 kg)  Height: 5\' 7"  (1.702 m)    This visit occurred during the SARS-CoV-2 public health emergency.  Safety protocols were in place, including screening questions prior to the visit, additional usage of staff PPE, and extensive cleaning of exam room while observing appropriate contact time as indicated for disinfecting solutions.   Assessment & Plan:

## 2019-11-12 NOTE — Patient Instructions (Addendum)
We will get the ears cleaned out today.

## 2019-11-12 NOTE — Assessment & Plan Note (Signed)
Ear disimpaction done with irrigation during visit and hearing immediately restored. Tolerated well without complications.

## 2020-02-08 ENCOUNTER — Encounter: Payer: Self-pay | Admitting: Family Medicine

## 2020-02-08 ENCOUNTER — Ambulatory Visit: Payer: PPO | Admitting: Family Medicine

## 2020-02-08 ENCOUNTER — Other Ambulatory Visit: Payer: Self-pay

## 2020-02-08 DIAGNOSIS — M5136 Other intervertebral disc degeneration, lumbar region: Secondary | ICD-10-CM

## 2020-02-08 DIAGNOSIS — M51369 Other intervertebral disc degeneration, lumbar region without mention of lumbar back pain or lower extremity pain: Secondary | ICD-10-CM

## 2020-02-08 HISTORY — DX: Other intervertebral disc degeneration, lumbar region without mention of lumbar back pain or lower extremity pain: M51.369

## 2020-02-08 HISTORY — DX: Other intervertebral disc degeneration, lumbar region: M51.36

## 2020-02-08 MED ORDER — GABAPENTIN 100 MG PO CAPS: 200.0000 mg | ORAL_CAPSULE | Freq: Every day | ORAL | 3 refills | Status: DC

## 2020-02-08 NOTE — Assessment & Plan Note (Signed)
Degenerative disc disease of the lumbar spine.  Discussed home exercises, discussed further treatment in more detail.  Discussed the possibility of formal physical therapy.  Gabapentin prescribed today.  X-rays pending.  Increase activity slowly.  Follow-up again in 4 to 8 weeks worsening pain encouraged to physical therapy again

## 2020-02-08 NOTE — Patient Instructions (Addendum)
Good to see you Gabapentin 200 mg at night Get a kneeler when in the yard  Exercise 3 times a week See me again in 6-8 weeks

## 2020-02-08 NOTE — Progress Notes (Signed)
[name] [address] [place] [address] Phone: [phone] Subjective:   I Ronelle Nigh am serving as a Neurosurgeon for Dr. Antoine Primas.  This visit occurred during the SARS-CoV-2 public health emergency.  Safety protocols were in place, including screening questions prior to the visit, additional usage of staff PPE, and extensive cleaning of exam room while observing appropriate contact time as indicated for disinfecting solutions.   I'm seeing this patient by the request  of:  Myrlene Broker, MD  CC: Low back pain  [id number]  Megan Romero is a 69 y.o. female coming in with complaint of low back pain. Patient states the pain comes and goes. Patient states she is sedentary during the week. Patient has noticed that she has gained weight. Was told she has scoliosis. Sometimes her left hip aches with the back pain. Patient sleeps on her stomach and uses flatter pillows.   Onset- Chronic  Location - low back pain Duration- constant  Character- sharp Aggravating factors- sitting Reliving factors-  Therapies tried- pillows behind her back, sitting on pillows, tylenol, exercises   Severity-  5/10 at its worse      Past Medical History:  Diagnosis Date  . Depression   . Thyroid disease    Hypothyroidism   Past Surgical History:  Procedure Laterality Date  . ABDOMINAL HYSTERECTOMY    . CARDIAC ELECTROPHYSIOLOGY MAPPING AND ABLATION    . CARPAL TUNNEL RELEASE     b/l   Social History   Socioeconomic History  . Marital status: Widowed    Spouse name: Not on file  . Number of children: Not on file  . Years of education: Not on file  . Highest education level: Not on file  Occupational History  . Not on file  Tobacco Use  . Smoking status: Former Smoker    Types: Cigarettes    Quit date: 02/11/2017    Years since quitting: 2.9  . Smokeless tobacco: Never Used  Substance and Sexual Activity  . Alcohol use: Yes  . Drug  use: No  . Sexual activity: Not Currently    Partners: Male  Other Topics Concern  . Not on file  Social History Narrative  . Not on file   Social Determinants of Health   Financial Resource Strain:   . Difficulty of Paying Living Expenses:   Food Insecurity:   . Worried About Programme researcher, broadcasting/film/video in the Last Year:   . Barista in the Last Year:   Transportation Needs:   . Freight forwarder (Medical):   Marland Kitchen Lack of Transportation (Non-Medical):   Physical Activity:   . Days of Exercise per Week:   . Minutes of Exercise per Session:   Stress:   . Feeling of Stress :   Social Connections:   . Frequency of Communication with Friends and Family:   . Frequency of Social Gatherings with Friends and Family:   . Attends Religious Services:   . Active Member of Clubs or Organizations:   . Attends Banker Meetings:   Marland Kitchen Marital Status:    Allergies  Allergen Reactions  . Penicillins    Family History  Problem Relation Age of Onset  . Heart disease Mother        mvp  . Alzheimer's disease Father     Current Outpatient Medications (Endocrine & Metabolic):  .  levothyroxine (SYNTHROID) 137 MCG tablet, Take 1 tablet (137 mcg total) by  mouth daily before breakfast.    Current Outpatient Medications (Analgesics):  .  acetaminophen (TYLENOL) 500 MG tablet, Take 500 mg by mouth every 6 (six) hours as needed. Marland Kitchen  aspirin 81 MG tablet, Take 81 mg by mouth daily.     Current Outpatient Medications (Other):  Marland Kitchen  SHINGRIX injection,  .  gabapentin (NEURONTIN) 100 MG capsule, Take 2 capsules (200 mg total) by mouth at bedtime.   Reviewed prior external information including notes and imaging from  primary care provider As well as notes that were available from care everywhere and other healthcare systems.  Past medical history, social, surgical and family history all reviewed in electronic medical record.  No pertanent information unless stated regarding to the  chief complaint.   Review of Systems:  No headache, visual changes, nausea, vomiting, diarrhea, constipation, dizziness, abdominal pain, skin rash, fevers, chills, night sweats, weight loss, swollen lymph nodes, body aches, joint swelling, chest pain, shortness of breath, mood changes. POSITIVE muscle aches  Objective  Blood pressure (!) 142/82, pulse 77, height 5\' 7"  (1.702 m), weight 176 lb (79.8 kg), SpO2 98 %.   General: No apparent distress alert and oriented x3 mood and affect normal, dressed appropriately.  HEENT: Pupils equal, extraocular movements intact  Respiratory: Patient's speak in full sentences and does not appear short of breath  Cardiovascular: No lower extremity edema, non tender, no erythema  Neuro: Cranial nerves II through XII are intact, neurovascularly intact in all extremities with 2+ DTRs and 2+ pulses.  Gait normal with good balance and coordination.  MSK: Moderate arthritic changes of multiple joints  Low back exam does show some tenderness to palpation in the paraspinal musculature of the lumbar spine right greater than left.  Tightness with Corky Sox but no radicular symptoms.  Patient does have some limited sidebending bilaterally.  Limited extension as well.  [id number]; 15 additional minutes spent for Therapeutic exercises as stated in above notes.  This included exercises focusing on stretching, strengthening, with significant focus on eccentric aspects.   Long term goals include an improvement in range of motion, strength, endurance as well as avoiding reinjury. Patient's frequency would include in 1-2 times a day, 3-5 times a week for a duration of 6-12 weeks.  Low back exercises that included:  Pelvic tilt/bracing instruction to focus on control of the pelvic girdle and lower abdominal muscles  Glute strengthening exercises, focusing on proper firing of the glutes without engaging the low back muscles Proper stretching techniques for maximum relief for the hamstrings,  hip flexors, low back and some rotation where tolerated  Proper technique shown and discussed handout in great detail with ATC.  All questions were discussed and answered.     Impression and Recommendations:     The above documentation has been reviewed and is accurate and complete Lyndal Pulley, DO       Note: This dictation was prepared with Dragon dictation along with smaller phrase technology. Any transcriptional errors that result from this process are unintentional.

## 2020-04-24 ENCOUNTER — Other Ambulatory Visit: Payer: Self-pay | Admitting: Internal Medicine

## 2020-04-24 DIAGNOSIS — E039 Hypothyroidism, unspecified: Secondary | ICD-10-CM

## 2020-07-29 ENCOUNTER — Other Ambulatory Visit: Payer: Self-pay | Admitting: Internal Medicine

## 2020-07-29 DIAGNOSIS — E039 Hypothyroidism, unspecified: Secondary | ICD-10-CM

## 2020-08-05 ENCOUNTER — Ambulatory Visit: Payer: PPO

## 2020-08-08 DIAGNOSIS — S0501XA Injury of conjunctiva and corneal abrasion without foreign body, right eye, initial encounter: Secondary | ICD-10-CM | POA: Diagnosis not present

## 2020-08-22 DIAGNOSIS — H25013 Cortical age-related cataract, bilateral: Secondary | ICD-10-CM | POA: Diagnosis not present

## 2020-08-22 DIAGNOSIS — H524 Presbyopia: Secondary | ICD-10-CM | POA: Diagnosis not present

## 2020-08-22 DIAGNOSIS — H2513 Age-related nuclear cataract, bilateral: Secondary | ICD-10-CM | POA: Diagnosis not present

## 2020-08-22 DIAGNOSIS — S0501XD Injury of conjunctiva and corneal abrasion without foreign body, right eye, subsequent encounter: Secondary | ICD-10-CM | POA: Diagnosis not present

## 2020-08-22 DIAGNOSIS — H35033 Hypertensive retinopathy, bilateral: Secondary | ICD-10-CM | POA: Diagnosis not present

## 2020-10-03 DIAGNOSIS — H04123 Dry eye syndrome of bilateral lacrimal glands: Secondary | ICD-10-CM | POA: Diagnosis not present

## 2020-10-03 DIAGNOSIS — S0501XD Injury of conjunctiva and corneal abrasion without foreign body, right eye, subsequent encounter: Secondary | ICD-10-CM | POA: Diagnosis not present

## 2020-10-29 ENCOUNTER — Other Ambulatory Visit: Payer: Self-pay | Admitting: Internal Medicine

## 2020-10-29 DIAGNOSIS — E039 Hypothyroidism, unspecified: Secondary | ICD-10-CM

## 2020-10-31 DIAGNOSIS — H532 Diplopia: Secondary | ICD-10-CM | POA: Diagnosis not present

## 2020-10-31 DIAGNOSIS — H524 Presbyopia: Secondary | ICD-10-CM | POA: Diagnosis not present

## 2020-10-31 DIAGNOSIS — H04123 Dry eye syndrome of bilateral lacrimal glands: Secondary | ICD-10-CM | POA: Diagnosis not present

## 2020-10-31 DIAGNOSIS — H18591 Other hereditary corneal dystrophies, right eye: Secondary | ICD-10-CM | POA: Diagnosis not present

## 2020-10-31 DIAGNOSIS — H35033 Hypertensive retinopathy, bilateral: Secondary | ICD-10-CM | POA: Diagnosis not present

## 2020-11-28 ENCOUNTER — Telehealth: Payer: Self-pay | Admitting: Internal Medicine

## 2020-11-28 ENCOUNTER — Ambulatory Visit (INDEPENDENT_AMBULATORY_CARE_PROVIDER_SITE_OTHER): Payer: PPO | Admitting: Internal Medicine

## 2020-11-28 ENCOUNTER — Other Ambulatory Visit: Payer: Self-pay

## 2020-11-28 ENCOUNTER — Encounter: Payer: Self-pay | Admitting: Internal Medicine

## 2020-11-28 VITALS — BP 122/80 | HR 75 | Temp 98.4°F | Resp 18 | Ht 67.0 in | Wt 174.6 lb

## 2020-11-28 DIAGNOSIS — E039 Hypothyroidism, unspecified: Secondary | ICD-10-CM | POA: Diagnosis not present

## 2020-11-28 DIAGNOSIS — Z0000 Encounter for general adult medical examination without abnormal findings: Secondary | ICD-10-CM | POA: Diagnosis not present

## 2020-11-28 LAB — COMPREHENSIVE METABOLIC PANEL
ALT: 16 U/L (ref 0–35)
AST: 15 U/L (ref 0–37)
Albumin: 4.4 g/dL (ref 3.5–5.2)
Alkaline Phosphatase: 38 U/L — ABNORMAL LOW (ref 39–117)
BUN: 24 mg/dL — ABNORMAL HIGH (ref 6–23)
CO2: 28 mEq/L (ref 19–32)
Calcium: 9.6 mg/dL (ref 8.4–10.5)
Chloride: 104 mEq/L (ref 96–112)
Creatinine, Ser: 0.86 mg/dL (ref 0.40–1.20)
GFR: 68.53 mL/min (ref >60.00)
Glucose, Bld: 99 mg/dL (ref 70–99)
Potassium: 4.4 mEq/L (ref 3.5–5.1)
Sodium: 139 mEq/L (ref 135–145)
Total Bilirubin: 1.1 mg/dL (ref 0.2–1.2)
Total Protein: 6.6 g/dL (ref 6.0–8.3)

## 2020-11-28 LAB — LIPID PANEL
Cholesterol: 204 mg/dL — ABNORMAL HIGH (ref 0–200)
HDL: 45.5 mg/dL (ref >39.00)
LDL Cholesterol: 127 mg/dL — ABNORMAL HIGH (ref 0–99)
NonHDL: 158.15
Total CHOL/HDL Ratio: 4
Triglycerides: 157 mg/dL — ABNORMAL HIGH (ref 0.0–149.0)
VLDL: 31.4 mg/dL (ref 0.0–40.0)

## 2020-11-28 LAB — CBC
HCT: 40.4 % (ref 36.0–46.0)
Hemoglobin: 13.8 g/dL (ref 12.0–15.0)
MCHC: 34.2 g/dL (ref 30.0–36.0)
MCV: 93.5 fl (ref 78.0–100.0)
Platelets: 217 10*3/uL (ref 150.0–400.0)
RBC: 4.32 Mil/uL (ref 3.87–5.11)
RDW: 12.8 % (ref 11.5–15.5)
WBC: 5.4 10*3/uL (ref 4.0–10.5)

## 2020-11-28 LAB — TSH: TSH: 0.58 u[IU]/mL (ref 0.35–4.50)

## 2020-11-28 LAB — T4, FREE: Free T4: 1.09 ng/dL (ref 0.60–1.60)

## 2020-11-28 MED ORDER — LEVOTHYROXINE SODIUM 137 MCG PO TABS: 137.0000 ug | ORAL_TABLET | Freq: Every day | ORAL | 3 refills | Status: DC

## 2020-11-28 NOTE — Progress Notes (Signed)
Subjective:   Patient ID: Megan Romero, female    DOB: [birth year], 70 y.o.   MRN: [id number]  HPI Here for medicare wellness and physical, no new complaints. Please see A/P for status and treatment of chronic medical problems.   Diet: heart healthy Physical activity: sedentary, will start golfing now that weather is improving Depression/mood screen: negative Hearing: intact to whispered voice, mild loss Visual acuity: grossly normal, performs annual eye exam  ADLs: capable Fall risk: none Home safety: good Cognitive evaluation: intact to orientation, naming, recall and repetition EOL planning: adv directives discussed  Flowsheet Row Office Visit from 11/28/2020 in [facility] Total Score 0      I have personally reviewed and have noted 1. The patient's medical and social history - reviewed today no changes 2. Their use of alcohol, tobacco or illicit drugs 3. Their current medications and supplements 4. The patient's functional ability including ADL's, fall risks, home safety risks and hearing or visual impairment. 5. Diet and physical activities 6. Evidence for depression or mood disorders 7. Care team reviewed and updated 8.  The patient is on an opioid pain medication and this was reviewed with patient and non-opioid pain medication options were reviewed and offered to patient, their pain treatment plan and severity was discussed with them. We have considered referrals as appropriate for patient. Opioid risk factors were also considered and reviewed.   Patient Care Team: Myrlene Broker, MD as PCP - General (Internal Medicine) Past Medical History:  Diagnosis Date  . Depression   . Thyroid disease    Hypothyroidism   Past Surgical History:  Procedure Laterality Date  . ABDOMINAL HYSTERECTOMY    . CARDIAC ELECTROPHYSIOLOGY MAPPING AND ABLATION    . CARPAL TUNNEL RELEASE     b/l   Family History  Problem Relation Age of Onset   . Heart disease Mother        mvp  . Alzheimer's disease Father    Review of Systems  Constitutional: Negative.   HENT: Negative.   Eyes: Negative.   Respiratory: Negative for cough, chest tightness and shortness of breath.   Cardiovascular: Negative for chest pain, palpitations and leg swelling.  Gastrointestinal: Negative for abdominal distention, abdominal pain, constipation, diarrhea, nausea and vomiting.  Musculoskeletal: Negative.   Skin: Negative.   Neurological: Negative.   Psychiatric/Behavioral: Negative.     Objective:  Physical Exam Constitutional:      Appearance: She is well-developed.  HENT:     Head: Normocephalic and atraumatic.  Cardiovascular:     Rate and Rhythm: Normal rate and regular rhythm.  Pulmonary:     Effort: Pulmonary effort is normal. No respiratory distress.     Breath sounds: Normal breath sounds. No wheezing or rales.  Abdominal:     General: Bowel sounds are normal. There is no distension.     Palpations: Abdomen is soft.     Tenderness: There is no abdominal tenderness. There is no rebound.  Musculoskeletal:     Cervical back: Normal range of motion.  Skin:    General: Skin is warm and dry.  Neurological:     Mental Status: She is alert and oriented to person, place, and time.     Coordination: Coordination normal.     Vitals:   11/28/20 0835  BP: 122/80  Pulse: 75  Resp: 18  Temp: 98.4 F (36.9 C)  TempSrc: Oral  SpO2: 95%  Weight: 174 lb 9.6 oz (79.2  kg)  Height: 5\' 7"  (1.702 m)    This visit occurred during the SARS-CoV-2 public health emergency.  Safety protocols were in place, including screening questions prior to the visit, additional usage of staff PPE, and extensive cleaning of exam room while observing appropriate contact time as indicated for disinfecting solutions.   Assessment & Plan:

## 2020-11-28 NOTE — Assessment & Plan Note (Signed)
Checking TSH and free T4 and adjust synthroid 137 mcg daily. She is having weight gain which may be associated with low levels.

## 2020-11-28 NOTE — Assessment & Plan Note (Signed)
Flu shot declines. Covid-19 2 shots no booster encouraged. Pneumonia completed. Shingrix counseled. Tetanus due Sept 2022. Colonoscopy declines. Mammogram declines, pap smear aged out and dexa declines. Counseled about sun safety and mole surveillance. Counseled about the dangers of distracted driving. Given 10 year screening recommendations.

## 2020-11-28 NOTE — Patient Instructions (Signed)
Health Maintenance, Female Adopting a healthy lifestyle and getting preventive care are important in promoting health and wellness. Ask your health care provider about:  The right schedule for you to have regular tests and exams.  Things you can do on your own to prevent diseases and keep yourself healthy. What should I know about diet, weight, and exercise? Eat a healthy diet  Eat a diet that includes plenty of vegetables, fruits, low-fat dairy products, and lean protein.  Do not eat a lot of foods that are high in solid fats, added sugars, or sodium.   Maintain a healthy weight Body mass index (BMI) is used to identify weight problems. It estimates body fat based on height and weight. Your health care provider can help determine your BMI and help you achieve or maintain a healthy weight. Get regular exercise Get regular exercise. This is one of the most important things you can do for your health. Most adults should:  Exercise for at least 150 minutes each week. The exercise should increase your heart rate and make you sweat (moderate-intensity exercise).  Do strengthening exercises at least twice a week. This is in addition to the moderate-intensity exercise.  Spend less time sitting. Even light physical activity can be beneficial. Watch cholesterol and blood lipids Have your blood tested for lipids and cholesterol at 71 years of age, then have this test every 5 years. Have your cholesterol levels checked more often if:  Your lipid or cholesterol levels are high.  You are older than 71 years of age.  You are at high risk for heart disease. What should I know about cancer screening? Depending on your health history and family history, you may need to have cancer screening at various ages. This may include screening for:  Breast cancer.  Cervical cancer.  Colorectal cancer.  Skin cancer.  Lung cancer. What should I know about heart disease, diabetes, and high blood  pressure? Blood pressure and heart disease  High blood pressure causes heart disease and increases the risk of stroke. This is more likely to develop in people who have high blood pressure readings, are of African descent, or are overweight.  Have your blood pressure checked: ? Every 3-5 years if you are 18-39 years of age. ? Every year if you are 40 years old or older. Diabetes Have regular diabetes screenings. This checks your fasting blood sugar level. Have the screening done:  Once every three years after age 40 if you are at a normal weight and have a low risk for diabetes.  More often and at a younger age if you are overweight or have a high risk for diabetes. What should I know about preventing infection? Hepatitis B If you have a higher risk for hepatitis B, you should be screened for this virus. Talk with your health care provider to find out if you are at risk for hepatitis B infection. Hepatitis C Testing is recommended for:  Everyone born from 1945 through 1965.  Anyone with known risk factors for hepatitis C. Sexually transmitted infections (STIs)  Get screened for STIs, including gonorrhea and chlamydia, if: ? You are sexually active and are younger than 71 years of age. ? You are older than 71 years of age and your health care provider tells you that you are at risk for this type of infection. ? Your sexual activity has changed since you were last screened, and you are at increased risk for chlamydia or gonorrhea. Ask your health care provider   if you are at risk.  Ask your health care provider about whether you are at high risk for HIV. Your health care provider may recommend a prescription medicine to help prevent HIV infection. If you choose to take medicine to prevent HIV, you should first get tested for HIV. You should then be tested every 3 months for as long as you are taking the medicine. Pregnancy  If you are about to stop having your period (premenopausal) and  you may become pregnant, seek counseling before you get pregnant.  Take 400 to 800 micrograms (mcg) of folic acid every day if you become pregnant.  Ask for birth control (contraception) if you want to prevent pregnancy. Osteoporosis and menopause Osteoporosis is a disease in which the bones lose minerals and strength with aging. This can result in bone fractures. If you are 65 years old or older, or if you are at risk for osteoporosis and fractures, ask your health care provider if you should:  Be screened for bone loss.  Take a calcium or vitamin D supplement to lower your risk of fractures.  Be given hormone replacement therapy (HRT) to treat symptoms of menopause. Follow these instructions at home: Lifestyle  Do not use any products that contain nicotine or tobacco, such as cigarettes, e-cigarettes, and chewing tobacco. If you need help quitting, ask your health care provider.  Do not use street drugs.  Do not share needles.  Ask your health care provider for help if you need support or information about quitting drugs. Alcohol use  Do not drink alcohol if: ? Your health care provider tells you not to drink. ? You are pregnant, may be pregnant, or are planning to become pregnant.  If you drink alcohol: ? Limit how much you use to 0-1 drink a day. ? Limit intake if you are breastfeeding.  Be aware of how much alcohol is in your drink. In the [place] one drink equals one 12 oz bottle of beer (355 mL), one 5 oz glass of wine (148 mL), or one 1 oz glass of hard liquor (44 mL). General instructions  Schedule regular health, dental, and eye exams.  Stay current with your vaccines.  Tell your health care provider if: ? You often feel depressed. ? You have ever been abused or do not feel safe at home. Summary  Adopting a healthy lifestyle and getting preventive care are important in promoting health and wellness.  Follow your health care provider's instructions about healthy  diet, exercising, and getting tested or screened for diseases.  Follow your health care provider's instructions on monitoring your cholesterol and blood pressure. This information is not intended to replace advice given to you by your health care provider. Make sure you discuss any questions you have with your health care provider. Document Revised: 08/12/2018 Document Reviewed: 08/12/2018 Elsevier Patient Education  2021 [employer]  

## 2020-11-28 NOTE — Telephone Encounter (Signed)
LVM for pt to rtn my call to schedule AWV with [facility]. Please schedule AWV if pt calls the office  

## 2020-12-10 ENCOUNTER — Encounter: Payer: Self-pay | Admitting: Internal Medicine

## 2020-12-10 DIAGNOSIS — E039 Hypothyroidism, unspecified: Secondary | ICD-10-CM

## 2020-12-11 MED ORDER — LEVOTHYROXINE SODIUM 150 MCG PO TABS: 150.0000 ug | ORAL_TABLET | Freq: Every day | ORAL | 3 refills | Status: DC

## 2021-03-06 ENCOUNTER — Other Ambulatory Visit: Payer: Self-pay | Admitting: Internal Medicine

## 2021-05-25 ENCOUNTER — Encounter: Payer: Self-pay | Admitting: Internal Medicine

## 2021-06-01 ENCOUNTER — Encounter: Payer: Self-pay | Admitting: Internal Medicine

## 2021-06-01 ENCOUNTER — Telehealth (INDEPENDENT_AMBULATORY_CARE_PROVIDER_SITE_OTHER): Payer: PPO | Admitting: Internal Medicine

## 2021-06-01 DIAGNOSIS — U071 COVID-19: Secondary | ICD-10-CM | POA: Diagnosis not present

## 2021-06-01 NOTE — Progress Notes (Signed)
Virtual Visit via Video Note  I connected with Megan Romero on 06/01/21 at 11:00 AM EDT by a video enabled telemedicine application and verified that I am speaking with the correct person using two identifiers.   I discussed the limitations of evaluation and management by telemedicine and the availability of in person appointments. The patient expressed understanding and agreed to proceed.  Present for the visit:  Myself, Dr Cheryll Cockayne, Vevelyn Royals.  The patient is currently at home and I am in the office.    No referring provider.    History of Present Illness: This is an acute visit for covid  Her symptoms started 8 days ago.  She was positive 7 days ago.  She was vaccinated against COVID, but did not have any boosters.  Her biggest reason for calling is that she is still very symptomatic and wanted reassurance that she is going to get better and that all of her symptoms are consistent with COVID.  She continues to have fever, chills, fatigue, decreased appetite.  She has thick, clear nasal congestion, mild sinus pain, sore throat that is better.  She continues to have a dry hacking cough, but denies any shortness of breath or wheezing.  She has had diarrhea, nausea, back pain, body aches, headaches and some lightheadedness.  She is sleeping 5 hrs a day.  She sleeps all night.   Taking tylenol and aleve.  At this point she feels like she has not been a feel like good ever again.  She was hoping she would start to see more improvement by now.  She has been out of work for the past week and is not sure if she can return this weekend.   Review of Systems  Constitutional:  Positive for chills, fever and malaise/fatigue.       Dec appetite  HENT:  Positive for congestion (thick, clear mucus), sinus pain and sore throat (better).   Respiratory:  Positive for cough (dry). Negative for shortness of breath and wheezing.   Gastrointestinal:  Positive for diarrhea and nausea.   Musculoskeletal:  Positive for back pain and myalgias.  Neurological:  Positive for headaches.       Lightheaded     Social History   Socioeconomic History   Marital status: Widowed    Spouse name: Not on file   Number of children: Not on file   Years of education: Not on file   Highest education level: Not on file  Occupational History   Not on file  Tobacco Use   Smoking status: Former    Types: Cigarettes    Quit date: 02/11/2017    Years since quitting: 4.3   Smokeless tobacco: Never  Substance and Sexual Activity   Alcohol use: Yes   Drug use: No   Sexual activity: Not Currently    Partners: Male  Other Topics Concern   Not on file  Social History Narrative   Not on file   Social Determinants of Health   Financial Resource Strain: Not on file  Food Insecurity: Not on file  Transportation Needs: Not on file  Physical Activity: Not on file  Stress: Not on file  Social Connections: Not on file     Observations/Objective: Appears well in NAD Intermittent dry cough, breathing normally and does not appear to be short of breath, talking in complete sentences Nondiaphoretic, skin appears warm and dry  Assessment and Plan:  COVID: Acute She is out of the window for antiviral She  is still very symptomatic and has diffuse symptoms and wanted some reassurance Reviewed symptoms-all very typical of COVID and fortunately she is seeing little improvement since her symptoms started Continue Tylenol, Aleve and other medications to help with symptoms She is eating minimally and drinking some-encouraged her to increase her drinking and try to eat whenever she feels like Encouraged her over the next couple of days to start to do more around the house and maybe go for a walk outside She would like to return to work middle of next week-note given to be out for the next few days  Encouraged her to call with any questions or concerns   Follow Up Instructions:    I  discussed the assessment and treatment plan with the patient. The patient was provided an opportunity to ask questions and all were answered. The patient agreed with the plan and demonstrated an understanding of the instructions.   The patient was advised to call back or seek an in-person evaluation if the symptoms worsen or if the condition fails to improve as anticipated.    Pincus Sanes, MD

## 2021-06-04 ENCOUNTER — Ambulatory Visit (HOSPITAL_COMMUNITY): Payer: PPO

## 2021-06-04 ENCOUNTER — Emergency Department (HOSPITAL_COMMUNITY)
Admission: EM | Admit: 2021-06-04 | Discharge: 2021-06-04 | Disposition: A | Discharge status: Home / Self Care | Payer: PPO | Attending: Student | Admitting: Student

## 2021-06-04 ENCOUNTER — Telehealth: Payer: PPO | Admitting: Family Medicine

## 2021-06-04 ENCOUNTER — Other Ambulatory Visit: Payer: Self-pay

## 2021-06-04 ENCOUNTER — Encounter (HOSPITAL_COMMUNITY): Payer: Self-pay

## 2021-06-04 ENCOUNTER — Telehealth: Payer: Self-pay | Admitting: Internal Medicine

## 2021-06-04 DIAGNOSIS — Z5321 Procedure and treatment not carried out due to patient leaving prior to being seen by health care provider: Secondary | ICD-10-CM | POA: Diagnosis not present

## 2021-06-04 DIAGNOSIS — R002 Palpitations: Secondary | ICD-10-CM | POA: Diagnosis not present

## 2021-06-04 DIAGNOSIS — U071 COVID-19: Secondary | ICD-10-CM | POA: Insufficient documentation

## 2021-06-04 DIAGNOSIS — R519 Headache, unspecified: Secondary | ICD-10-CM | POA: Diagnosis not present

## 2021-06-04 DIAGNOSIS — R0602 Shortness of breath: Secondary | ICD-10-CM | POA: Diagnosis not present

## 2021-06-04 LAB — BASIC METABOLIC PANEL
Anion gap: 8 (ref 5–15)
BUN: 24 mg/dL — ABNORMAL HIGH (ref 8–23)
CO2: 26 mmol/L (ref 22–32)
Calcium: 8.9 mg/dL (ref 8.9–10.3)
Chloride: 102 mmol/L (ref 98–111)
Creatinine, Ser: 0.79 mg/dL (ref 0.44–1.00)
GFR, Estimated: >60 mL/min (ref >60)
Glucose, Bld: 109 mg/dL — ABNORMAL HIGH (ref 70–99)
Potassium: 3.8 mmol/L (ref 3.5–5.1)
Sodium: 136 mmol/L (ref 135–145)

## 2021-06-04 LAB — CBC WITH DIFFERENTIAL/PLATELET
Abs Immature Granulocytes: 0.02 10*3/uL (ref 0.00–0.07)
Basophils Absolute: 0 10*3/uL (ref 0.0–0.1)
Basophils Relative: 0 %
Eosinophils Absolute: 0 10*3/uL (ref 0.0–0.5)
Eosinophils Relative: 0 %
HCT: 40.8 % (ref 36.0–46.0)
Hemoglobin: 13.8 g/dL (ref 12.0–15.0)
Immature Granulocytes: 0 %
Lymphocytes Relative: 34 %
Lymphs Abs: 1.6 10*3/uL (ref 0.7–4.0)
MCH: 31.8 pg (ref 26.0–34.0)
MCHC: 33.8 g/dL (ref 30.0–36.0)
MCV: 94 fL (ref 80.0–100.0)
Monocytes Absolute: 0.4 10*3/uL (ref 0.1–1.0)
Monocytes Relative: 9 %
Neutro Abs: 2.6 10*3/uL (ref 1.7–7.7)
Neutrophils Relative %: 57 %
Platelets: 218 10*3/uL (ref 150–400)
RBC: 4.34 MIL/uL (ref 3.87–5.11)
RDW: 11.9 % (ref 11.5–15.5)
WBC: 4.6 10*3/uL (ref 4.0–10.5)
nRBC: 0 % (ref 0.0–0.2)

## 2021-06-04 NOTE — Telephone Encounter (Signed)
[facility]   Caller states she is coughing and her heart rate spikes to 114. She zero energy. She tested positive for Covid on 05/25/21.   Advised to go to ED now. Patient understood and went to [facility].

## 2021-06-04 NOTE — Telephone Encounter (Signed)
Patient calling in with  respiratory symptoms: Shortness of breath, chest pain, palpitations or other red words send to Triage  Transferred patient to [facility] due to complaining of irregular heartbeat

## 2021-06-04 NOTE — Telephone Encounter (Signed)
Noted  

## 2021-06-04 NOTE — ED Triage Notes (Signed)
Pt arrived via POV, c/o COVID dx 9/23. Expressing SOB,, episodes of high heart rate.

## 2021-06-04 NOTE — ED Provider Notes (Cosign Needed)
Emergency Medicine Provider Triage Evaluation Note  Megan Romero , a 70 y.o. female  was evaluated in triage.  Pt complains of COVID with headache, palpitations and shortness of breath. She was diagnosed with COVID on 9/23, she has has 2 doses of COVID vaccine, no boosters. Symptoms continue to worsen, difficulty holding a conversation due to worsening shortness of breath.  Denies chest pain.  Review of Systems  Positive: Cough, body aches, headache, shortness of breath, weakness, palpitations Negative: Vomiting, chest pain  Physical Exam  BP 121/86 (BP Location: Left Arm)   Pulse 88   Resp 18   SpO2 95%  Gen:   Awake, no distress   Resp:  Normal effort , breath sounds slightly diminished wheezing noted.  Satting at 95% on room air MSK:   Moves extremities without difficulty  Other:    Medical Decision Making  Medically screening exam initiated at 12:38 PM.  Appropriate orders placed.  Lorre Munroe was informed that the remainder of the evaluation will be completed by another provider, this initial triage assessment does not replace that evaluation, and the importance of remaining in the ED until their evaluation is complete.     Dartha Lodge, [place] 06/04/21 1244

## 2021-06-05 ENCOUNTER — Telehealth (INDEPENDENT_AMBULATORY_CARE_PROVIDER_SITE_OTHER): Payer: PPO | Admitting: Family Medicine

## 2021-06-05 ENCOUNTER — Encounter: Payer: Self-pay | Admitting: Internal Medicine

## 2021-06-05 DIAGNOSIS — U071 COVID-19: Secondary | ICD-10-CM | POA: Diagnosis not present

## 2021-06-05 MED ORDER — BENZONATATE 100 MG PO CAPS: 100.0000 mg | ORAL_CAPSULE | Freq: Three times a day (TID) | ORAL | 0 refills | Status: DC | PRN

## 2021-06-05 NOTE — Patient Instructions (Addendum)
  HOME CARE TIPS:   -I sent the medication(s) we discussed to your pharmacy: Meds ordered this encounter  Medications   benzonatate (TESSALON PERLES) 100 MG capsule    Sig: Take 1 capsule (100 mg total) by mouth 3 (three) times daily as needed.    Dispense:  20 capsule    Refill:  0     -can use tylenol or aleve if needed for fevers, aches and pains per instructions  -can use nasal saline a few times per day if you have nasal congestion; sometimes  a short course of Afrin nasal spray for 3 days can help with symptoms as well  -stay hydrated, drink plenty of fluids and eat small healthy meals   -avoid dairy and red meat  -can try imodium if any further diarrhea (this is available over the counter.)  -can take 1000 IU ( ) Vit D3 and 100-500 mg of Vit C daily per instructions  -follow up with your doctor in 2-3 days unless improving and feeling better  -stay home while sick, except to seek medical care. If you have COVID19, ideally it would be best to stay home for a full 10 days since the onset of symptoms PLUS one day of no fever and feeling better. Wear a good mask that fits snugly (such as N95 or KN95) if around others to reduce the risk of transmission.  It was nice to meet you today, and I really hope you are feeling better soon. I help [place] out with telemedicine visits on Tuesdays and Thursdays and am available for visits on those days. If you have any concerns or questions following this visit please schedule a follow up visit with your Primary Care doctor or seek care at a local urgent care clinic to avoid delays in care.    Seek in person care or schedule a follow up video visit promptly if your symptoms worsen, new concerns arise or you are not improving with treatment. Call 911 and/or seek emergency care if your symptoms are severe or life threatening.

## 2021-06-05 NOTE — Progress Notes (Signed)
Virtual Visit via Video Note  I connected with Megan Romero  on 06/05/21 at  4:20 PM EDT by a video enabled telemedicine application and verified that I am speaking with the correct person using two identifiers.  Location patient: home, [place] Location provider:work or home office Persons participating in the virtual visit: patient, provider  I discussed the limitations of evaluation and management by telemedicine and the availability of in person appointments. The patient expressed understanding and agreed to proceed.   HPI:  Acute telemedicine visit for Covid19: -Onset: 23 of Sept; tested positive for covid that day -Symptoms included: fevers, chills, nausea, diarrhea, vomiting, congestion, cough, body aches, SOB, fatigue -still has a cough - when talks makes her start coughing, still poor appetite, has some sob at times with the cough - reports was sent to the ER yesterday when called [place] because could not stop coughing. Reports had labs and EKG there. Also has some diarrhea intermittently -doing a little better today and was able to eat some food today -Denies: CP, SOB currently (reports is doing better today), inability to eat/drink/get out of bed -Pertinent past medical history:see below -Pertinent medication allergies:  Allergies  Allergen Reactions   Penicillins   -COVID-19 vaccine status: 2 doses -wonders when she can get her booster  ROS: See pertinent positives and negatives per HPI.  Past Medical History:  Diagnosis Date   Depression    Thyroid disease    Hypothyroidism    Past Surgical History:  Procedure Laterality Date   ABDOMINAL HYSTERECTOMY     CARDIAC ELECTROPHYSIOLOGY MAPPING AND ABLATION     CARPAL TUNNEL RELEASE     b/l     Current Outpatient Medications:    benzonatate (TESSALON PERLES) 100 MG capsule, Take 1 capsule (100 mg total) by mouth 3 (three) times daily as needed., Disp: 20 capsule, Rfl: 0   acetaminophen (TYLENOL) 500 MG tablet, Take 500 mg  by mouth every 6 (six) hours as needed., Disp: , Rfl:    aspirin 81 MG tablet, Take 81 mg by mouth daily., Disp: , Rfl:    levothyroxine (SYNTHROID) 150 MCG tablet, Take 1 tablet (150 mcg total) by mouth daily., Disp: 90 tablet, Rfl: 3  EXAM:  VITALS per patient if applicable:  GENERAL: alert, oriented, appears well and in no acute distress  HEENT: atraumatic, conjunttiva clear, no obvious abnormalities on inspection of external nose and ears  NECK: normal movements of the head and neck  LUNGS: on inspection no signs of respiratory distress, breathing rate appears normal, no obvious gross SOB, gasping or wheezing  CV: no obvious cyanosis  MS: moves all visible extremities without noticeable abnormality  PSYCH/NEURO: pleasant and cooperative, no obvious depression or anxiety, speech and thought processing grossly intact  ASSESSMENT AND PLAN:  Discussed the following assessment and plan:  COVID-19  Discussed covid treatment, complications, isolation, etc.  On day 11 of a covid illness and reports is finally doing a little better today. Opted for tessalon for the cough and advised no dairy and could use imodium for the diarrhea. Other symptomatic care measures summarized in patient instructions. Answered questions regarding booster.  Advised to seek prompt in person care if worsening, new symptoms arise, or if is not improving with treatment. Discussed options for inperson care if PCP office not available. Did let this patient know that I only do telemedicine on Tuesdays and Thursdays for [place]. Advised to schedule follow up visit with PCP or [facility] if any further questions or concerns to  avoid delays in care.   I discussed the assessment and treatment plan with the patient. The patient was provided an opportunity to ask questions and all were answered. The patient agreed with the plan and demonstrated an understanding of the instructions.     Terressa Koyanagi, DO

## 2021-11-21 ENCOUNTER — Other Ambulatory Visit: Payer: Self-pay

## 2021-11-21 ENCOUNTER — Ambulatory Visit: Payer: PPO | Admitting: Obstetrics & Gynecology

## 2021-11-21 ENCOUNTER — Encounter: Payer: Self-pay | Admitting: Obstetrics & Gynecology

## 2021-11-21 ENCOUNTER — Telehealth: Payer: Self-pay | Admitting: *Deleted

## 2021-11-21 VITALS — BP 137/78 | HR 67 | Ht 67.5 in | Wt 170.8 lb

## 2021-11-21 DIAGNOSIS — R3914 Feeling of incomplete bladder emptying: Secondary | ICD-10-CM | POA: Diagnosis not present

## 2021-11-21 DIAGNOSIS — N816 Rectocele: Secondary | ICD-10-CM | POA: Diagnosis not present

## 2021-11-21 DIAGNOSIS — N8110 Cystocele, unspecified: Secondary | ICD-10-CM

## 2021-11-21 DIAGNOSIS — Z90710 Acquired absence of both cervix and uterus: Secondary | ICD-10-CM | POA: Diagnosis not present

## 2021-11-21 NOTE — Progress Notes (Signed)
? ?GYNECOLOGY OFFICE VISIT NOTE ? ?History:  ? Megan Romero is a 71 y.o. [phone] with history of SVD x 3 followed by [facility]  for benign indications several years ago, here today for evaluation of feeling a bulge in her vagina for a few months.  Feels "like a tampon is up there".  Also reports incomplete bladder emptying, worsens as day progresses, but no incontinence of urine, flatus or stool.  Has not been sexually active for over 14 years.  She denies any abnormal vaginal discharge, bleeding, pelvic pain or other concerns.  ?  ?Past Medical History:  ?Diagnosis Date  ? B12 deficiency 04/28/2019  ? Degenerative disc disease, lumbar 02/08/2020  ? Depression   ? Hypothyroidism 05/26/2008  ? 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 Thyroid disease   ? Hypothyroidism  ? ? ?Past Surgical History:  ?Procedure Laterality Date  ? ABDOMINAL HYSTERECTOMY    ? CARDIAC ELECTROPHYSIOLOGY MAPPING AND ABLATION    ? CARPAL TUNNEL RELEASE    ? b/l  ? ? ?The following portions of the patient's history were reviewed and updated as appropriate: allergies, current medications, past family history, past medical history, past social history, past surgical history and problem list.  ? ?Review of Systems:  ?Pertinent items noted in HPI and remainder of comprehensive ROS otherwise negative. ? ?Physical Exam:  ?BP 137/78   Pulse 67   Ht 5' 7.5" (1.715 m)   Wt 170 lb 12.8 oz (77.5 kg)   BMI 26.36 kg/m?  ?CONSTITUTIONAL: Well-developed, well-nourished female in no acute distress.  ?HEENT:  Normocephalic, atraumatic. External right and left ear normal. No scleral icterus.  ?NECK: Normal range of motion, supple, no masses noted on observation ?SKIN: No rash noted. Not diaphoretic. No erythema. No pallor. ?MUSCULOSKELETAL: Normal range of motion. No edema noted. ?NEUROLOGIC: Alert and oriented to person, place, and time. Normal muscle tone coordination. No cranial nerve deficit noted. ?PSYCHIATRIC: Normal mood and affect.  Normal behavior. Normal judgment and thought content. ?CARDIOVASCULAR: Normal heart rate noted ?RESPIRATORY: Effort and breath sounds normal, no problems with respiration noted ?ABDOMEN: No masses noted. No other overt distention noted.   ?PELVIC: Normal appearing external genitalia and urethral meatus with moderate atrophy diffusely; atrophic vaginal mucosa and cuff.  Grade 1 cystocele and Grade 2 rectocele note, some cuff prolapse also noted.  No abnormal discharge noted.  Performed in the presence of a chaperone ?   ?Assessment and Plan:  ?   ?1. Baden-Walker grade 2 rectocele ?2. Baden-Walker grade 1 cystocele ?3. Feeling of incomplete bladder emptying ?4. S/P total abdominal hysterectomy ?Given her incomplete bladder emptying and pelvic organ prolapse, she was referred to Urogynecology subspecialist for further evaluation and management. Patient agreed with this plan.  Discussed that further testing was needed in order to come up with solutions that will work for her, briefly discussed conservative management with pessaries vs surgical management. All questions answered. Will follow up Dr. Jari Favre recommendations.  ?- Ambulatory referral to Urogynecology ?Routine preventative health maintenance measures emphasized, mammogram offered to patient and she will consider this as she has not had one in years. ?Please refer to After Visit Summary for other counseling recommendations.  ? ?Return for any gynecologic concerns.   ? ?I spent 30 minutes dedicated to the care of this patient including pre-visit review of records, face to face time with the patient discussing her conditions and treatments and post visit orders. ? ? ? ?Jaynie Collins, MD, FACOG ?  Obstetrician Heritage manager, Faculty Practice ?Center for [employer], [facility] ?

## 2021-11-21 NOTE — Telephone Encounter (Signed)
Follow up TC to offer pt Mammogram screening. Patient planning to obtain free mammogram screening through her employer's health program. ?

## 2021-12-02 ENCOUNTER — Encounter: Payer: Self-pay | Admitting: Obstetrics & Gynecology

## 2021-12-05 ENCOUNTER — Telehealth: Payer: Self-pay | Admitting: Internal Medicine

## 2021-12-05 NOTE — Telephone Encounter (Signed)
LVM for pt to rtn my call to schedule AWV with [facility]. Please schedule if pt calls the office.  ?

## 2021-12-10 NOTE — Progress Notes (Signed)
? ? Megan Romero D.[name] ?[facility] ?[address] [place] [address] ?Phone: [phone] ?  ?Assessment and Plan:   ?  ?1. Acute pain of left knee ?2. Primary osteoarthritis of left knee ?-Chronic with exacerbation, initial sports medicine visit ?- Likely acute flare of left knee pain due to fall with underlying left knee osteoarthritis.  Bony contusion may be contributing.  Cannot rule out meniscal pathology at this time ?- Patient elected for intra-articular CSI and tolerated well per note below ?- Start Tylenol 500 to 1000 mg tablets 2-3 times a day for day-to-day pain relief ?-Start HEP for knee ?- X-ray obtained in clinic.  My interpretation: No acute fracture or dislocation.  Mild decreased joint space and medial compartment compared to lateral compartment.  Mild sclerosis along medial compartment ? ?Procedure: Knee Joint Injection ?Side: Left ?Indication: Acute flare of osteoarthritis ? ?Risks explained and consent was given verbally. The site was cleaned with alcohol prep. A needle was introduced with an anterio-lateral approach. Injection given using 25mL of 1% lidocaine without epinephrine and 37mL of kenalog 40mg /ml. This was well tolerated and resulted in symptomatic relief.  Needle was removed, hemostasis achieved, and post injection instructions were explained.   Pt was advised to call or return to clinic if these symptoms worsen or fail to improve as anticipated.  ?  ?Pertinent previous records reviewed include none ?  ?Follow Up: As needed if no improvement or worsening of symptoms in 2 to 3 weeks.  Would repeat physical exam and could consider MRI to further evaluate for possible meniscal pathology ?  ?Subjective:   ?I, , am serving as a Megan Romero for Doctor Neurosurgeon ? ?Chief Complaint: left knee injury  ? ?HPI:  ?12/11/2021 ?Patient is a 71 year old female complaining of left knee pain. Patient states that she fell crossing the creek while she  was golfing , was on hole 11 and finished 18 and has plays since then has been able to still play and work can go upstairs but cant do downstairs unless she is leading with the left , March 17th, no numbness tingling it just stings hurts all the time , pain stays right in the anterior knee, has been aleve and tylenol and has been icing,  ? ?Relevant Historical Information: Hypothyroidism ? ?Additional pertinent review of systems negative. ? ? ?Current Outpatient Medications:  ?  aspirin 81 MG tablet, Take 81 mg by mouth daily., Disp: , Rfl:  ?  levothyroxine (SYNTHROID) 150 MCG tablet, Take 1 tablet (150 mcg total) by mouth daily., Disp: 90 tablet, Rfl: 3 ?  naproxen (NAPROSYN) 250 MG tablet, Take by mouth daily as needed., Disp: , Rfl:   ? ?Objective:   ?  ?Vitals:  ? 12/11/21 1305  ?BP: 130/80  ?Pulse: 89  ?SpO2: 96%  ?Height: 5\' 7"  (1.702 m)  ?  ?  ?Body mass index is 26.75 kg/m?.  ?  ?Physical Exam:   ? ?General:  awake, alert oriented, no acute distress nontoxic ?Skin: no suspicious lesions or rashes ?Neuro:sensation intact, no deficits, strength 5/5 with no deficits, no atrophy, normal muscle tone ?Psych: No signs of anxiety, depression or other mood disorder ? ?Knee: ?Mild swelling along lateral/anterior knee at area of contusion ?No deformity ?Neg fluid wave, joint milking ?ROM Flex 100, Ext 5 ?TTP lateral femoral condyle, lateral joint line ?NTTP over the quad tendon, medial fem condyle,  patella, plica, patella tendon, tibial tuberostiy, fibular head, posterior fossa, pes  anserine bursa, gerdy's tubercle, medial jt line,  ?Neg anterior and posterior drawer ?Neg lachman ?Neg sag sign ?Negative varus stress ?Negative valgus stress ?Negative McMurray ?Positive Thessaly ? ?Gait normal  ? ? ?Electronically signed by:  ?Megan Romero D.[name] ?[facility] ?2:05 PM 12/11/21 ?

## 2021-12-11 ENCOUNTER — Ambulatory Visit (INDEPENDENT_AMBULATORY_CARE_PROVIDER_SITE_OTHER): Payer: PPO

## 2021-12-11 ENCOUNTER — Ambulatory Visit: Payer: PPO | Admitting: Sports Medicine

## 2021-12-11 VITALS — BP 130/80 | HR 89 | Ht 67.0 in

## 2021-12-11 DIAGNOSIS — M25562 Pain in left knee: Secondary | ICD-10-CM

## 2021-12-11 DIAGNOSIS — M1712 Unilateral primary osteoarthritis, left knee: Secondary | ICD-10-CM | POA: Diagnosis not present

## 2021-12-11 NOTE — Patient Instructions (Addendum)
Good to see you  ?Knee HEP  ?Tylenol [phone] mg 2-3 times a day for pain relief  ?As needed follow up if no improvement 2-3 week follow up  ? ?

## 2021-12-12 DIAGNOSIS — H524 Presbyopia: Secondary | ICD-10-CM | POA: Diagnosis not present

## 2021-12-12 DIAGNOSIS — H25813 Combined forms of age-related cataract, bilateral: Secondary | ICD-10-CM | POA: Diagnosis not present

## 2021-12-12 DIAGNOSIS — H04123 Dry eye syndrome of bilateral lacrimal glands: Secondary | ICD-10-CM | POA: Diagnosis not present

## 2021-12-12 DIAGNOSIS — H35371 Puckering of macula, right eye: Secondary | ICD-10-CM | POA: Diagnosis not present

## 2021-12-12 DIAGNOSIS — H18593 Other hereditary corneal dystrophies, bilateral: Secondary | ICD-10-CM | POA: Diagnosis not present

## 2021-12-12 DIAGNOSIS — H33321 Round hole, right eye: Secondary | ICD-10-CM | POA: Diagnosis not present

## 2022-01-09 ENCOUNTER — Ambulatory Visit: Payer: PPO | Admitting: Obstetrics & Gynecology

## 2022-01-09 VITALS — BP 146/63 | HR 87 | Wt 169.0 lb

## 2022-01-09 DIAGNOSIS — N8110 Cystocele, unspecified: Secondary | ICD-10-CM | POA: Diagnosis not present

## 2022-01-09 DIAGNOSIS — N816 Rectocele: Secondary | ICD-10-CM | POA: Diagnosis not present

## 2022-01-09 DIAGNOSIS — R3914 Feeling of incomplete bladder emptying: Secondary | ICD-10-CM

## 2022-01-09 NOTE — Progress Notes (Signed)
Patient ID: Megan Romero, female   DOB: April 26, 1951, 71 y.o.   MRN: [id number] ? ?Chief Complaint  ?Patient presents with  ? Gynecologic Exam  ?  Pessary  ? ? ?HPI ?Megan Romero is a 71 y.o. female.  [id number] ?No LMP recorded. Patient has had a hysterectomy. ?She comes to be evaluated for pessary ?Feeling of incomplete bladder emptying ? ?Baden-Walker grade 1 cystocele ? ?Baden-Walker grade 2 rectocele ? ?HPI ? ?Past Medical History:  ?Diagnosis Date  ? B12 deficiency 04/28/2019  ? Degenerative disc disease, lumbar 02/08/2020  ? Depression   ? Hypothyroidism 05/26/2008  ? 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 Thyroid disease   ? Hypothyroidism  ? ? ?Past Surgical History:  ?Procedure Laterality Date  ? ABDOMINAL HYSTERECTOMY    ? CARDIAC ELECTROPHYSIOLOGY MAPPING AND ABLATION    ? CARPAL TUNNEL RELEASE    ? b/l  ? ? ?Family History  ?Problem Relation Age of Onset  ? Heart disease Mother   ?     mvp  ? Alzheimer's disease Father   ? ? ?Social History ?Social History  ? ?Tobacco Use  ? Smoking status: Former  ?  Types: Cigarettes  ?  Quit date: 02/11/2017  ?  Years since quitting: 4.9  ? Smokeless tobacco: Never  ?Substance Use Topics  ? Alcohol use: Yes  ? Drug use: No  ? ? ?Allergies  ?Allergen Reactions  ? Penicillins   ? ? ?Current Outpatient Medications  ?Medication Sig Dispense Refill  ? aspirin 81 MG tablet Take 81 mg by mouth daily.    ? levothyroxine (SYNTHROID) 150 MCG tablet Take 1 tablet (150 mcg total) by mouth daily. 90 tablet 3  ? naproxen (NAPROSYN) 250 MG tablet Take by mouth daily as needed.    ? ?No current facility-administered medications for this visit.  ? ? ?Review of Systems ?Review of Systems  ?Constitutional: Negative.   ?Respiratory: Negative.    ?Cardiovascular: Negative.   ?Gastrointestinal: Negative.   ?Genitourinary:  Positive for difficulty urinating. Negative for vaginal bleeding and vaginal discharge.  ? ?Blood pressure (!) 146/63, pulse 87, weight 169 lb (76.7  kg). ? ?Physical Exam ?Physical Exam ?Vitals and nursing note reviewed. Exam conducted with a chaperone present.  ?Constitutional:   ?   Appearance: Normal appearance.  ?Cardiovascular:  ?   Rate and Rhythm: Normal rate.  ?Pulmonary:  ?   Effort: Pulmonary effort is normal.  ?Genitourinary: ?   General: Normal vulva.  ?   Exam position: Lithotomy position.  ?   Vagina: No vaginal discharge.  ?   Comments: When standing cystocele descends to just below th introitus, no masses ? ?Ring with support Milex size 3 placed and tolerated when standing. This was removed and Milex 3 will be ordered ?Skin: ?   General: Skin is warm and dry.  ?Neurological:  ?   Mental Status: She is alert.  ?Psychiatric:     ?   Mood and Affect: Mood normal.     ?   Behavior: Behavior normal.  ? ? ?Data Reviewed ?Office note reviewed ? ?Assessment ?Feeling of incomplete bladder emptying ? ?Baden-Walker grade 1 cystocele ? ?Baden-Walker grade 2 rectocele ? Pessary fitted ? ?Plan ?Return when pessary is available. She will be comfortable with removal and cleaning herself. She has appointment with Dr. Florian Buff in August ? ? ? ?Scheryl Darter ?01/09/2022, 3:53 PM ? ? ? ?

## 2022-01-25 ENCOUNTER — Telehealth: Payer: Self-pay | Admitting: Emergency Medicine

## 2022-01-25 NOTE — Telephone Encounter (Signed)
TC from patient. Patient requesting pessary that was ordered on 5/10. This RN cannot locate pessary, will f/u with patient about order status.

## 2022-02-20 ENCOUNTER — Telehealth: Payer: Self-pay | Admitting: Internal Medicine

## 2022-02-20 NOTE — Telephone Encounter (Signed)
LVM for pt to rtn my call to [phone] to schedule AWV with [facility]  

## 2022-03-08 ENCOUNTER — Other Ambulatory Visit: Payer: Self-pay | Admitting: Internal Medicine

## 2022-03-12 ENCOUNTER — Telehealth: Payer: Self-pay | Admitting: Internal Medicine

## 2022-03-12 NOTE — Telephone Encounter (Signed)
Pt is calling requesting refill before she goes out of town to her sisters. levothyroxine (SYNTHROID) 150 MCG tablet  Pharmacy: [facility] [place], [place] - [address]  LOV 11/28/20 ROV 03/19/22

## 2022-03-12 NOTE — Telephone Encounter (Signed)
Refill has been sent to the pt's pharmacy  

## 2022-03-19 ENCOUNTER — Ambulatory Visit: Payer: PPO | Admitting: Internal Medicine

## 2022-03-26 ENCOUNTER — Encounter: Payer: Self-pay | Admitting: Internal Medicine

## 2022-03-26 ENCOUNTER — Ambulatory Visit (INDEPENDENT_AMBULATORY_CARE_PROVIDER_SITE_OTHER): Payer: PPO | Admitting: Internal Medicine

## 2022-03-26 VITALS — BP 116/70 | HR 64 | Resp 18 | Ht 67.0 in | Wt 169.0 lb

## 2022-03-26 DIAGNOSIS — E039 Hypothyroidism, unspecified: Secondary | ICD-10-CM | POA: Diagnosis not present

## 2022-03-26 DIAGNOSIS — Z0000 Encounter for general adult medical examination without abnormal findings: Secondary | ICD-10-CM | POA: Diagnosis not present

## 2022-03-26 LAB — CBC
HCT: 41.1 % (ref 36.0–46.0)
Hemoglobin: 13.9 g/dL (ref 12.0–15.0)
MCHC: 33.9 g/dL (ref 30.0–36.0)
MCV: 95.2 fl (ref 78.0–100.0)
Platelets: 218 10*3/uL (ref 150.0–400.0)
RBC: 4.32 Mil/uL (ref 3.87–5.11)
RDW: 12.7 % (ref 11.5–15.5)
WBC: 6 10*3/uL (ref 4.0–10.5)

## 2022-03-26 LAB — LIPID PANEL
Cholesterol: 196 mg/dL (ref 0–200)
HDL: 48.8 mg/dL (ref >39.00)
LDL Cholesterol: 123 mg/dL — ABNORMAL HIGH (ref 0–99)
NonHDL: 146.79
Total CHOL/HDL Ratio: 4
Triglycerides: 117 mg/dL (ref 0.0–149.0)
VLDL: 23.4 mg/dL (ref 0.0–40.0)

## 2022-03-26 LAB — COMPREHENSIVE METABOLIC PANEL
ALT: 17 U/L (ref 0–35)
AST: 17 U/L (ref 0–37)
Albumin: 4.4 g/dL (ref 3.5–5.2)
Alkaline Phosphatase: 42 U/L (ref 39–117)
BUN: 22 mg/dL (ref 6–23)
CO2: 28 mEq/L (ref 19–32)
Calcium: 9.7 mg/dL (ref 8.4–10.5)
Chloride: 105 mEq/L (ref 96–112)
Creatinine, Ser: 0.84 mg/dL (ref 0.40–1.20)
GFR: 69.84 mL/min (ref >60.00)
Glucose, Bld: 108 mg/dL — ABNORMAL HIGH (ref 70–99)
Potassium: 5 mEq/L (ref 3.5–5.1)
Sodium: 140 mEq/L (ref 135–145)
Total Bilirubin: 1.1 mg/dL (ref 0.2–1.2)
Total Protein: 6.5 g/dL (ref 6.0–8.3)

## 2022-03-26 LAB — TSH: TSH: 0.22 u[IU]/mL — ABNORMAL LOW (ref 0.35–5.50)

## 2022-03-26 LAB — T4, FREE: Free T4: 1.24 ng/dL (ref 0.60–1.60)

## 2022-03-26 NOTE — Patient Instructions (Signed)
We will check the labs today. 

## 2022-03-26 NOTE — Progress Notes (Signed)
Subjective:   Patient ID: Megan Romero, female    DOB: November 04, 1950, 71 y.o.   MRN: [id number]  HPI Here for medicare wellness and physical, no new complaints. Please see A/P for status and treatment of chronic medical problems.   Diet: heart healthy Physical activity: sedentary Depression/mood screen: negative Hearing: intact to whispered voice Visual acuity: grossly normal with contacts, performs annual eye exam  ADLs: capable Fall risk: none Home safety: good Cognitive evaluation: intact to orientation, naming, recall and repetition EOL planning: adv directives discussed  Flowsheet Row Office Visit from 03/26/2022 in [facility] at [employer]  PHQ-2 Total Score 0       Flowsheet Row Office Visit from 03/26/2022 in [facility] at [facility]  PHQ-9 Total Score 0         04/01/2019    3:42 PM 11/28/2020    8:41 AM 11/28/2020    8:49 AM 06/04/2021   12:40 PM 03/26/2022    8:22 AM  Fall Risk  Falls in the past year?  0 0  0  Number of falls in past year - Comments Emmi Telephone Survey Actual Response =       Was there an injury with Fall?  0   0  Fall Risk Category Calculator  0   0  Fall Risk Category  Low   Low  Patient Fall Risk Level    Low fall risk     I have personally reviewed and have noted 1. The patient's medical and social history - reviewed today no changes 2. Their use of alcohol, tobacco or illicit drugs 3. Their current medications and supplements 4. The patient's functional ability including ADL's, fall risks, home safety risks and hearing or visual impairment. 5. Diet and physical activities 6. Evidence for depression or mood disorders 7. Care team reviewed and updated 8.  The patient is not on an opioid pain medication.  Patient Care Team: Myrlene Broker, MD as PCP - General (Internal Medicine) Past Medical History:  Diagnosis Date   B12 deficiency 04/28/2019   Degenerative disc disease, lumbar 02/08/2020   Depression     Hypothyroidism 05/26/2008   Loss of transverse plantar arch of right foot 10/01/2016   Thyroid disease    Hypothyroidism   Past Surgical History:  Procedure Laterality Date   ABDOMINAL HYSTERECTOMY     CARDIAC ELECTROPHYSIOLOGY MAPPING AND ABLATION     CARPAL TUNNEL RELEASE     b/l   Family History  Problem Relation Age of Onset   Heart disease Mother        mvp   Alzheimer's disease Father    Review of Systems  Constitutional: Negative.   HENT: Negative.    Eyes: Negative.   Respiratory:  Negative for cough, chest tightness and shortness of breath.   Cardiovascular:  Negative for chest pain, palpitations and leg swelling.  Gastrointestinal:  Negative for abdominal distention, abdominal pain, constipation, diarrhea, nausea and vomiting.  Musculoskeletal: Negative.   Skin: Negative.   Neurological: Negative.   Psychiatric/Behavioral: Negative.      Objective:  Physical Exam Constitutional:      Appearance: She is well-developed.  HENT:     Head: Normocephalic and atraumatic.  Cardiovascular:     Rate and Rhythm: Normal rate and regular rhythm.  Pulmonary:     Effort: Pulmonary effort is normal. No respiratory distress.     Breath sounds: Normal breath sounds. No wheezing or rales.  Abdominal:  General: Bowel sounds are normal. There is no distension.     Palpations: Abdomen is soft.     Tenderness: There is no abdominal tenderness. There is no rebound.  Musculoskeletal:     Cervical back: Normal range of motion.  Skin:    General: Skin is warm and dry.  Neurological:     Mental Status: She is alert and oriented to person, place, and time.     Coordination: Coordination normal.     Vitals:   03/26/22 0818  BP: 116/70  Pulse: 64  Resp: 18  SpO2: 95%  Weight: 169 lb (76.7 kg)  Height: 5\' 7"  (1.702 m)    Assessment & Plan:

## 2022-03-26 NOTE — Assessment & Plan Note (Signed)
Flu shot yearly. Covid-19 counseled. Pneumonia complete. Shingrix due 2nd declines. Tetanus due declines. Colonoscopy declines. Mammogram declines, pap smear declines and dexa offered declines. Counseled about sun safety and mole surveillance. Counseled about the dangers of distracted driving. Given 10 year screening recommendations.

## 2022-03-26 NOTE — Assessment & Plan Note (Signed)
Checking TSH and free T4 and increase synthroid if able due to persistent slugginess and weight gain. Taking 150 mcg daily synthroid currently and may adjust.

## 2022-03-27 ENCOUNTER — Other Ambulatory Visit: Payer: Self-pay | Admitting: Internal Medicine

## 2022-03-27 MED ORDER — LEVOTHYROXINE SODIUM 175 MCG PO TABS: 175.0000 ug | ORAL_TABLET | Freq: Every day | ORAL | 3 refills | Status: DC

## 2022-04-02 ENCOUNTER — Encounter: Payer: Self-pay | Admitting: Obstetrics and Gynecology

## 2022-04-02 ENCOUNTER — Ambulatory Visit: Payer: PPO | Admitting: Obstetrics and Gynecology

## 2022-04-02 VITALS — BP 118/76 | HR 79 | Ht 65.0 in | Wt 169.0 lb

## 2022-04-02 DIAGNOSIS — N993 Prolapse of vaginal vault after hysterectomy: Secondary | ICD-10-CM | POA: Diagnosis not present

## 2022-04-02 DIAGNOSIS — N816 Rectocele: Secondary | ICD-10-CM | POA: Diagnosis not present

## 2022-04-02 DIAGNOSIS — R350 Frequency of micturition: Secondary | ICD-10-CM | POA: Diagnosis not present

## 2022-04-02 DIAGNOSIS — N8110 Cystocele, unspecified: Secondary | ICD-10-CM | POA: Diagnosis not present

## 2022-04-02 LAB — POCT URINALYSIS DIPSTICK
Bilirubin, UA: NEGATIVE
Blood, UA: NEGATIVE
Glucose, UA: NEGATIVE
Ketones, UA: NEGATIVE
Nitrite, UA: NEGATIVE
Protein, UA: NEGATIVE
Spec Grav, UA: >=1.03 — AB (ref 1.010–1.025)
Urobilinogen, UA: 0.2 E.U./dL
pH, UA: 5.5 (ref 5.0–8.0)

## 2022-04-02 NOTE — Progress Notes (Signed)
[facility] New Patient Evaluation and Consultation  Referring Provider: Tereso Newcomer, MD PCP: Myrlene Broker, MD Date of Service: 04/02/2022  SUBJECTIVE Chief Complaint: New Patient (Initial Visit) Megan Romero is a 71 y.o. female here to find out if she can keep wearing the pessary. )  History of Present Illness: Megan Romero is a 71 y.o. White or Caucasian female seen in consultation at the request of Dr. Macon Large for evaluation of prolapse.    Review of records from Dr Macon Large significant for: She is s/p hysterectomy with prolapse. As the day progresses, has some sensation of incomplete bladder emptying.   Urinary Symptoms: Does not leak urine.   Day time voids 6-7.  Nocturia: 1-2 times per night to void. Voiding dysfunction: she does not empty her bladder well. She feels that she pushes with urination but has been doing that since she had her children. Not a steady stream.  Does not use a catheter to empty bladder.  When urinating, she feels a weak stream, difficulty starting urine stream, and dribbling after finishing.    UTIs:  0  UTI's in the last year.   Denies history of blood in urine and kidney or bladder stones  Pelvic Organ Prolapse Symptoms:                  She Admits to a feeling of a bulge the vaginal area. It has been present since Jan 2023- just noticed it one day.  She Denies seeing a bulge.  This bulge is bothersome. Had a #3 ring with support pessary- she is removing it about once a month. Does not have pessary in today.   Bowel Symptom: Bowel movements: 1 time(s) per day Stool consistency: hard or soft  Straining: no.  Splinting: no.  Incomplete evacuation: no.  She Denies accidental bowel leakage / fecal incontinence Bowel regimen: none  Sexual Function Sexually active: no.   Pelvic Pain Denies pelvic pain   Past Medical History:  Past Medical History:  Diagnosis Date   B12 deficiency 04/28/2019    Degenerative disc disease, lumbar 02/08/2020   Depression    Hypothyroidism 05/26/2008   Loss of transverse plantar arch of right foot 10/01/2016   Thyroid disease    Hypothyroidism     Past Surgical History:   Past Surgical History:  Procedure Laterality Date   ABDOMINAL HYSTERECTOMY     CARDIAC ELECTROPHYSIOLOGY MAPPING AND ABLATION     CARPAL TUNNEL RELEASE     b/l     Past OB/GYN History: OB History  Gravida Para Term Preterm AB Living  5 3 3  0 2 3  SAB IAB Ectopic Multiple Live Births  2 0 0 0 3    # Outcome Date GA Lbr Len/2nd Weight Sex Delivery Anes PTL Lv  5 SAB           4 SAB           3 Term           2 Term           1 Term             Vaginal deliveries: 3 S/p hysterectomy   Medications: She has a current medication list which includes the following prescription(s): aspirin and levothyroxine.   Allergies: Patient is allergic to penicillins.   Social History:  Social History   Tobacco Use   Smoking status: Former    Types: Cigarettes    Quit  date: 02/11/2017    Years since quitting: 5.1   Smokeless tobacco: Never  Vaping Use   Vaping Use: Never used  Substance Use Topics   Alcohol use: Yes   Drug use: No    Relationship status: widowed She lives alone.   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. Regular exercise: No History of abuse: No  Family History:   Family History  Problem Relation Age of Onset   Heart disease Mother        mvp   Alzheimer's disease Father      Review of Systems: Review of Systems  Constitutional:  Negative for fever, malaise/fatigue and weight loss.  Respiratory:  Negative for cough, shortness of breath and wheezing.   Cardiovascular:  Negative for chest pain, palpitations and leg swelling.  Gastrointestinal:  Negative for abdominal pain and blood in stool.  Genitourinary:  Negative for dysuria.  Musculoskeletal:  Negative for myalgias.  Skin:  Negative for rash.  Neurological:  Negative for dizziness  and headaches.  Endo/Heme/Allergies:  Does not bruise/bleed easily.  Psychiatric/Behavioral:  Negative for depression. The patient is not nervous/anxious.      OBJECTIVE Physical Exam: Vitals:   04/02/22 0844  BP: 118/76  Pulse: 79  Weight: 169 lb (76.7 kg)  Height: 5\' 5"  (1.651 m)    Physical Exam Constitutional:      General: She is not in acute distress. Pulmonary:     Effort: Pulmonary effort is normal.  Abdominal:     General: There is no distension.     Palpations: Abdomen is soft.     Tenderness: There is no abdominal tenderness. There is no rebound.  Musculoskeletal:        General: No swelling. Normal range of motion.  Skin:    General: Skin is warm and dry.     Findings: No rash.  Neurological:     Mental Status: She is alert and oriented to person, place, and time.  Psychiatric:        Mood and Affect: Mood normal.        Behavior: Behavior normal.      GU / Detailed Urogynecologic Evaluation:  Pelvic Exam: Normal external female genitalia; Bartholin's and Skene's glands normal in appearance; urethral meatus normal in appearance, no urethral masses or discharge.   CST: negative  s/p hysterectomy: Speculum exam reveals normal vaginal mucosa with  atrophy and normal vaginal cuff.  Adnexa no mass, fullness, tenderness.    Pelvic floor strength I/V  Pelvic floor musculature: Right levator non-tender, Right obturator non-tender, Left levator non-tender, Left obturator non-tender  POP-Q:   POP-Q  -1                                            Aa   -1                                           Ba  -6.5                                              C   2.5  Gh  4                                            Pb  7.5                                            tvl   -1                                            Ap  -1                                            Bp                                                  D     Rectal Exam:  Normal external rectum  Post-Void Residual (PVR) by Bladder Scan: In order to evaluate bladder emptying, we discussed obtaining a postvoid residual and she agreed to this procedure.  Procedure: The ultrasound unit was placed on the patient's abdomen in the suprapubic region after the patient had voided. A PVR of 6 ml was obtained by bladder scan.  Laboratory Results: POC urine: trace leukocytes  ASSESSMENT AND PLAN Ms. Kintzel is a 71 y.o. with:  1. Prolapse of anterior vaginal wall   2. Prolapse of posterior vaginal wall   3. Vaginal vault prolapse after hysterectomy   4. Urinary frequency    Stage II anterior, Stage II posterior, Stage I apical prolapse - For treatment of pelvic organ prolapse, we discussed options for management including expectant management, conservative management, and surgical management, such as Kegels, a pessary, pelvic floor physical therapy, and specific surgical procedures. - Was given handouts on surgery options.  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 Wants to continue the pessary. She maintains he pessary herself.  - Emptied bladder well today with normal PVR. Although she has been straining for several years, she feels she is able to empty. Will defer any urodynamic testing at this time. But if she wants to proceed with surgery, will need further evaluation.    Jaquita Folds, MD

## 2022-04-18 ENCOUNTER — Ambulatory Visit (INDEPENDENT_AMBULATORY_CARE_PROVIDER_SITE_OTHER): Payer: PPO

## 2022-04-18 ENCOUNTER — Ambulatory Visit: Payer: PPO | Admitting: Sports Medicine

## 2022-04-18 VITALS — Ht 65.0 in | Wt 169.0 lb

## 2022-04-18 DIAGNOSIS — M79641 Pain in right hand: Secondary | ICD-10-CM

## 2022-04-18 MED ORDER — MELOXICAM 15 MG PO TABS: 15.0000 mg | ORAL_TABLET | Freq: Every day | ORAL | 0 refills | Status: DC

## 2022-04-18 NOTE — Patient Instructions (Addendum)
Good to see you - Start meloxicam 15 mg daily x2 weeks.   Do not to use additional NSAIDs while taking meloxicam.  May use Tylenol [phone] mg 2 to 3 times a day for breakthrough pain. As needed follow up

## 2022-04-18 NOTE — Progress Notes (Signed)
    Megan Romero D.[facility] [address] [place] [address] Phone: [phone]   Assessment and Plan:     1. Right hand pain -Acute, uncomplicated, initial sports medicine visit - Pain over [facility] and scaphoid after FOOSH injury 11 days ago.  No severe TTP on physical exam and no sign of fracture of scaphoid on x-ray taken today.  Low risk of scaphoid fracture based on getting delayed x-rays at today's visit and physical exam. -- Start meloxicam 15 mg daily x2 weeks. Do not to use additional NSAIDs while taking meloxicam.  May use Tylenol [phone] mg 2 to 3 times a day for breakthrough pain. - X-ray obtained in clinic.  My interpretation: No acute fracture or dislocation.  Mild degenerative changes at [facility] and first MCP.  No sign of fracture or scaphoid or scapholunate gapping  DG Hand Complete Right; Future    Pertinent previous records reviewed include none   Follow Up: As needed if no improvement or worsening of symptoms   Subjective:   I, Megan Romero, am serving as a Neurosurgeon for Doctor [employer]  Chief Complaint: hand/ thumb pain   HPI:   04/18/22 Patient is a 71  year old female complaining of hand and thumb pain. Patient states that she fell 10 days a go and her right hand is in pain she was bruised and scratched up but now she feels like she has healed a little but not as much as she would like to have and she still hurts    Relevant Historical Information: None pertinent  Additional pertinent review of systems negative.   Current Outpatient Medications:    aspirin 81 MG tablet, Take 81 mg by mouth daily., Disp: , Rfl:    levothyroxine (SYNTHROID) 175 MCG tablet, Take 1 tablet (175 mcg total) by mouth daily., Disp: 90 tablet, Rfl: 3   Objective:     Vitals:   04/18/22 1433  Weight: 169 lb (76.7 kg)  Height: 5\' 5"  (1.651 m)      Body mass index is 28.12 kg/m.    Physical Exam:    General: Appears well, nad,  nontoxic and pleasant Neuro:sensation intact, strength is 5/5 with df/pf/inv/ev, muscle tone wnl Skin:no susupicious lesions or rashes  Right hand/wrist:  No deformity or swelling appreciated. Abrasion over palmar surface of CMC ROM  Ext 90, flexion70, radial/ulnar deviation 30 TTP mildly CMC and scaphoid nttp over the  dorsal carpals, volar carpals, radial styloid, ulnar styloid, tfcc   No pain with resisted ext, flex or deviation    Electronically signed by:  D.[facility] 2:54 PM 04/18/22

## 2022-06-24 ENCOUNTER — Encounter: Payer: Self-pay | Admitting: *Deleted

## 2022-07-31 NOTE — Progress Notes (Signed)
[name] [address] [place] [address] Phone: [phone] Subjective:   Megan Romero, am serving as a scribe for Dr. Antoine Primas.  I'm seeing this patient by the request  of:  Myrlene Broker, MD  CC: shoulder pain follow up   OEV:[id number]  Megan Romero is a 71 y.o. female coming in with complaint of shoulder, foot and hip pain. Has been seeing Dr. Jean Rosenthal for knee pain. Last seen in 2021. Patient states that she has plantar fasciitis in right heel and is so bad she has been taking a lot of tylenol. Patient also feels like because her gait is so off that her left hip is really painful now. She has tried Aspercreme, ice, heat. Patient is getting her oofos in December 5th so she is wearing her sperrys but with inserts.      Past Medical History:  Diagnosis Date   B12 deficiency 04/28/2019   Degenerative disc disease, lumbar 02/08/2020   Depression    Hypothyroidism 05/26/2008   Loss of transverse plantar arch of right foot 10/01/2016   Thyroid disease    Hypothyroidism   Past Surgical History:  Procedure Laterality Date   ABDOMINAL HYSTERECTOMY     CARDIAC ELECTROPHYSIOLOGY MAPPING AND ABLATION     CARPAL TUNNEL RELEASE     b/l   Social History   Socioeconomic History   Marital status: Widowed    Spouse name: Not on file   Number of children: Not on file   Years of education: Not on file   Highest education level: Not on file  Occupational History   Not on file  Tobacco Use   Smoking status: Former    Types: Cigarettes    Quit date: 02/11/2017    Years since quitting: 5.4   Smokeless tobacco: Never  Vaping Use   Vaping Use: Never used  Substance and Sexual Activity   Alcohol use: Yes   Drug use: No   Sexual activity: Not Currently    Partners: Male  Other Topics Concern   Not on file  Social History Narrative   Not on file   Social Determinants of Health   Financial Resource Strain: Not on file   Food Insecurity: Not on file  Transportation Needs: Not on file  Physical Activity: Not on file  Stress: Not on file  Social Connections: Not on file   Allergies  Allergen Reactions   Penicillins    Family History  Problem Relation Age of Onset   Heart disease Mother        mvp   Alzheimer's disease Father     Current Outpatient Medications (Endocrine & Metabolic):    levothyroxine (SYNTHROID) 175 MCG tablet, Take 1 tablet (175 mcg total) by mouth daily.    Current Outpatient Medications (Analgesics):    aspirin 81 MG tablet, Take 81 mg by mouth daily.   meloxicam (MOBIC) 15 MG tablet, Take 1 tablet (15 mg total) by mouth daily. (Patient not taking: Reported on 08/01/2022)     Reviewed prior external information including notes and imaging from  primary care provider As well as notes that were available from care everywhere and other healthcare systems.  Past medical history, social, surgical and family history all reviewed in electronic medical record.  No pertanent information unless stated regarding to the chief complaint.   Review of Systems:  No headache, visual changes, nausea, vomiting, diarrhea, constipation, dizziness, abdominal pain, skin rash, fevers, chills, night sweats,  weight loss, swollen lymph nodes, body aches, joint swelling, chest pain, shortness of breath, mood changes. POSITIVE muscle aches  Objective  Blood pressure 100/60, pulse 78, height 5\' 5"  (1.651 m), weight 165 lb (74.8 kg), SpO2 95 %.   General: No apparent distress alert and oriented x3 mood and affect normal, dressed appropriately.  HEENT: Pupils equal, extraocular movements intact  Respiratory: Patient's speak in full sentences and does not appear short of breath  Cardiovascular: No lower extremity edema, non tender, no erythema  Left hip exam does have severe tenderness to palpation over the greater trochanteric area.  Negative straight leg test.  Patient has good internal and external  range of motion with tightness with FABER test.  Low back does have some loss of lordosis.  Right foot exam does have some tenderness to palpation in the medial calcaneal area.  Good range of motion.  Mild breakdown of the longitudinal arch with splaying of the first and second toes.    After verbal consent patient was prepped with alcohol swab and with a 21-gauge 2 inch needle injected into the left greater trochanteric area with 2 cc of 0.5% Marcaine and 1 cc of Kenalog 40 mg/mL.  No blood loss.  Band-Aid placed.  Postinjection instructions given  [id number]; 15 additional minutes spent for Therapeutic exercises as stated in above notes.  This included exercises focusing on stretching, strengthening, with significant focus on eccentric aspects.   Long term goals include an improvement in range of motion, strength, endurance as well as avoiding reinjury. Patient's frequency would include in 1-2 times a day, 3-5 times a week for a duration of 6-12 weeks. Exercises for the foot include:  Stretches to help lengthen the lower leg and plantar fascia areas Theraband exercises for the lower leg and ankle to help strengthen the surrounding area- dorsiflexion, plantarflexion, inversion, eversion Massage rolling on the plantar surface of the foot with a frozen bottle, tennis ball or golf ball Towel or marble pick-ups to strengthen the plantar surface of the foot Weight bearing exercises to increase balance and overall stability    Proper technique shown and discussed handout in great detail with ATC.  All questions were discussed and answered.      Impression and Recommendations:     The above documentation has been reviewed and is accurate and complete Lyndal Pulley, DO

## 2022-08-01 ENCOUNTER — Ambulatory Visit (INDEPENDENT_AMBULATORY_CARE_PROVIDER_SITE_OTHER): Payer: PPO | Admitting: Family Medicine

## 2022-08-01 VITALS — BP 100/60 | HR 78 | Ht 65.0 in | Wt 165.0 lb

## 2022-08-01 DIAGNOSIS — M722 Plantar fascial fibromatosis: Secondary | ICD-10-CM | POA: Diagnosis not present

## 2022-08-01 DIAGNOSIS — M7062 Trochanteric bursitis, left hip: Secondary | ICD-10-CM

## 2022-08-01 DIAGNOSIS — M5136 Other intervertebral disc degeneration, lumbar region: Secondary | ICD-10-CM

## 2022-08-01 NOTE — Patient Instructions (Signed)
Good to see you  Ice 20 minutes 2 times daily. Usually after activity and before bed. Exercises 3 times a week.  GET THE OOFOS See me again in 8 weeks

## 2022-08-02 DIAGNOSIS — M7062 Trochanteric bursitis, left hip: Secondary | ICD-10-CM | POA: Insufficient documentation

## 2022-08-02 DIAGNOSIS — M722 Plantar fascial fibromatosis: Secondary | ICD-10-CM | POA: Insufficient documentation

## 2022-08-02 NOTE — Assessment & Plan Note (Signed)
We reviewed that stretching is critically important to the treatment of PF.  Reviewed footwear. Rigid soles have been shown to help with PF. Night splints can sometimes help. Reviewed rehab of stretching and calf raises.   Could benefit from a corticosteroid injection, orthotics, or other measures if conservative treatment fails. We discussed recovery sandals.  Follow-up again in 6 weeks

## 2022-08-02 NOTE — Assessment & Plan Note (Signed)
Low back exam- tightness and DDD known. COntinue core strengthening at this time.  Discussed monitoring for the FABER test.

## 2022-08-02 NOTE — Assessment & Plan Note (Signed)
New problem with good prognosis.  Discussed hip abductor strengthening.  Responded extremely well to the injection.  Discussed icing regimen and home exercises.  Continue to stay active.  Follow-up again in 6 to 8 weeks

## 2022-10-17 NOTE — Progress Notes (Signed)
Megan Romero Phone: [phone] Subjective:    I'm seeing this patient by the request  of:  Hoyt Koch, MD  CC: Hip pain  [id number]  08/01/2022 New problem with good prognosis. Discussed hip abductor strengthening. Responded extremely well to the injection. Discussed icing regimen and home exercises. Continue to stay active. Follow-up again in 6 to 8 weeks   Low back exam- tightness and DDD known. COntinue core strengthening at this time.  Discussed monitoring for the FABER test.      We reviewed that stretching is critically important to the treatment of PF.   Reviewed footwear. Rigid soles have been shown to help with PF. Night splints can sometimes help. Reviewed rehab of stretching and calf raises.    Could benefit from a corticosteroid injection, orthotics, or other measures if conservative treatment fails. We discussed recovery sandals.  Follow-up again in 6 weeks  Update 10/22/2022 Megan Romero is a 72 y.o. female coming in with complaint of L hip pain. Patient states      Past Medical History:  Diagnosis Date   B12 deficiency 04/28/2019   Degenerative disc disease, lumbar 02/08/2020   Depression    Hypothyroidism 05/26/2008   Loss of transverse plantar arch of right foot 10/01/2016   Thyroid disease    Hypothyroidism   Past Surgical History:  Procedure Laterality Date   ABDOMINAL HYSTERECTOMY     CARDIAC ELECTROPHYSIOLOGY MAPPING AND ABLATION     CARPAL TUNNEL RELEASE     b/l   Social History   Socioeconomic History   Marital status: Widowed    Spouse name: Not on file   Number of children: Not on file   Years of education: Not on file   Highest education level: Not on file  Occupational History   Not on file  Tobacco Use   Smoking status: Former    Types: Cigarettes    Quit date: 02/11/2017    Years since quitting: 5.6   Smokeless tobacco: Never  Vaping Use    Vaping Use: Never used  Substance and Sexual Activity   Alcohol use: Yes   Drug use: No   Sexual activity: Not Currently    Partners: Male  Other Topics Concern   Not on file  Social History Narrative   Not on file   Social Determinants of Health   Financial Resource Strain: Not on file  Food Insecurity: Not on file  Transportation Needs: Not on file  Physical Activity: Not on file  Stress: Not on file  Social Connections: Not on file   Allergies  Allergen Reactions   Penicillins    Family History  Problem Relation Age of Onset   Heart disease Mother        mvp   Alzheimer's disease Father     Current Outpatient Medications (Endocrine & Metabolic):    levothyroxine (SYNTHROID) 175 MCG tablet, Take 1 tablet (175 mcg total) by mouth daily.    Current Outpatient Medications (Analgesics):    aspirin 81 MG tablet, Take 81 mg by mouth daily.   meloxicam (MOBIC) 15 MG tablet, Take 1 tablet (15 mg total) by mouth daily. (Patient not taking: Reported on 08/01/2022)   Current Outpatient Medications (Other):    gabapentin (NEURONTIN) 100 MG capsule, Take 2 capsules (200 mg total) by mouth at bedtime.    Reviewed prior external information including notes and imaging from  primary care provider As well as  notes that were available from care everywhere and other healthcare systems.  Past medical history, social, surgical and family history all reviewed in electronic medical record.  No pertanent information unless stated regarding to the chief complaint.   Review of Systems:  No headache, visual changes, nausea, vomiting, diarrhea, constipation, dizziness, abdominal pain, skin rash, fevers, chills, night sweats, weight loss, swollen lymph nodes, body aches, joint swelling, chest pain, shortness of breath, mood changes. POSITIVE muscle aches  Objective  Height 5' 5"$  (1.651 m).   General: No apparent distress alert and oriented x3 mood and affect normal, dressed  appropriately.  HEENT: Pupils equal, extraocular movements intact  Respiratory: Patient's speak in full sentences and does not appear short of breath  Cardiovascular: No lower extremity edema, non tender, no erythema  Left hip exam does have some tenderness to palpation still over the greater trochanteric area.  Patient does have a positive FABER test.  Negative straight leg test but patient does have some loss of lordosis.  Antalgic gait favoring the left leg.   After verbal consent patient was prepped with alcohol swab and with a 21-gauge 2 inch needle injected into the left  greater trochanteric area with 2 cc of 0.5% Marcaine and 1 cc of Kenalog 40 mg/mL.  No blood loss.  Band-Aid placed.  Postinjection instructions given    Impression and Recommendations:     The above documentation has been reviewed and is accurate and complete Lyndal Pulley, DO

## 2022-10-23 ENCOUNTER — Ambulatory Visit: Payer: PPO | Admitting: Family Medicine

## 2022-10-23 ENCOUNTER — Encounter: Payer: Self-pay | Admitting: Family Medicine

## 2022-10-23 ENCOUNTER — Ambulatory Visit (INDEPENDENT_AMBULATORY_CARE_PROVIDER_SITE_OTHER): Payer: PPO

## 2022-10-23 VITALS — BP 122/72 | HR 69 | Ht 65.0 in | Wt 169.0 lb

## 2022-10-23 DIAGNOSIS — M7062 Trochanteric bursitis, left hip: Secondary | ICD-10-CM | POA: Diagnosis not present

## 2022-10-23 DIAGNOSIS — M545 Low back pain, unspecified: Secondary | ICD-10-CM | POA: Diagnosis not present

## 2022-10-23 DIAGNOSIS — M25552 Pain in left hip: Secondary | ICD-10-CM | POA: Diagnosis not present

## 2022-10-23 DIAGNOSIS — M5136 Other intervertebral disc degeneration, lumbar region: Secondary | ICD-10-CM

## 2022-10-23 MED ORDER — GABAPENTIN 100 MG PO CAPS: 200.0000 mg | ORAL_CAPSULE | Freq: Every day | ORAL | 0 refills | Status: DC

## 2022-10-23 NOTE — Patient Instructions (Addendum)
CoQ10 [place] Xrays today Gabapentin prescribed Consider MRI in 2 weeks if not better See you again in 6-8 weeks

## 2022-10-23 NOTE — Assessment & Plan Note (Signed)
Chronic problem with exacerbation.  Concerned that lumbar radiculopathy is also contributing to it at the moment.  Discussed icing regimen and home exercises.  Increase activity slowly as well.  Patient given gabapentin 200 mg to take at night secondary to the potential for radicular symptoms.  Follow-up with me again in 6 to 8 weeks.  Worsening pain or weakness do feel advanced imaging would be warranted.

## 2022-10-23 NOTE — Assessment & Plan Note (Signed)
As discussed concern for some lumbar radiculopathy.  Discussed gabapentin.  Increase activity slowly.  Worsening pain advanced imaging would be warranted.  MRI ordered today for further evaluation.

## 2022-12-09 NOTE — Progress Notes (Unsigned)
[name] [address] [place] [address] Phone: [phone] Subjective:   Megan Romero, am serving as a scribe for Dr. Antoine Primas.  I'm seeing this patient by the request  of:  Myrlene Broker, MD  CC: left hip and low back pain   [id number]  10/23/2022 As discussed concern for some lumbar radiculopathy. Discussed gabapentin. Increase activity slowly. Worsening pain advanced imaging would be warranted. MRI ordered today for further evaluation.   Chronic problem with exacerbation. Concerned that lumbar radiculopathy is also contributing to it at the moment. Discussed icing regimen and home exercises. Increase activity slowly as well. Patient given gabapentin 200 mg to take at night secondary to the potential for radicular symptoms. Follow-up with me again in 6 to 8 weeks. Worsening pain or weakness do feel advanced imaging would be warranted.   Update 12/11/2022 Megan Romero is a 72 y.o. female coming in with complaint of L hip and LBP. Patient states that her pain is not improving. Pain over GT of L hip. Able to play golf but her pain increases after playing. No pain in lumbar spine. Does not use gabapentin due to side effects.   Experiencing hand, leg and foot cramps. Unable to sleep due to cramping. Also having cramping at night.       Past Medical History:  Diagnosis Date   B12 deficiency 04/28/2019   Degenerative disc disease, lumbar 02/08/2020   Depression    Hypothyroidism 05/26/2008   Loss of transverse plantar arch of right foot 10/01/2016   Thyroid disease    Hypothyroidism   Past Surgical History:  Procedure Laterality Date   ABDOMINAL HYSTERECTOMY     CARDIAC ELECTROPHYSIOLOGY MAPPING AND ABLATION     CARPAL TUNNEL RELEASE     b/l   Social History   Socioeconomic History   Marital status: Widowed    Spouse name: Not on file   Number of children: Not on file   Years of education: Not on file   Highest  education level: Not on file  Occupational History   Not on file  Tobacco Use   Smoking status: Former    Types: Cigarettes    Quit date: 02/11/2017    Years since quitting: 5.8   Smokeless tobacco: Never  Vaping Use   Vaping Use: Never used  Substance and Sexual Activity   Alcohol use: Yes   Drug use: No   Sexual activity: Not Currently    Partners: Male  Other Topics Concern   Not on file  Social History Narrative   Not on file   Social Determinants of Health   Financial Resource Strain: Not on file  Food Insecurity: Not on file  Transportation Needs: Not on file  Physical Activity: Not on file  Stress: Not on file  Social Connections: Not on file   Allergies  Allergen Reactions   Penicillins    Family History  Problem Relation Age of Onset   Heart disease Mother        mvp   Alzheimer's disease Father     Current Outpatient Medications (Endocrine & Metabolic):    levothyroxine (SYNTHROID) 175 MCG tablet, Take 1 tablet (175 mcg total) by mouth daily.    Current Outpatient Medications (Analgesics):    aspirin 81 MG tablet, Take 81 mg by mouth daily.   meloxicam (MOBIC) 15 MG tablet, Take 1 tablet (15 mg total) by mouth daily.     Reviewed prior external information  including notes and imaging from  primary care provider As well as notes that were available from care everywhere and other healthcare systems.  Past medical history, social, surgical and family history all reviewed in electronic medical record.  No pertanent information unless stated regarding to the chief complaint.   Review of Systems:  No headache, visual changes, nausea, vomiting, diarrhea, constipation, dizziness, abdominal pain, skin rash, fevers, chills, night sweats, weight loss, swollen lymph nodes, body aches, joint swelling, chest pain, shortness of breath, mood changes. POSITIVE muscle aches as well as muscle cramping that seems to be worse at night but can happen during the  day  Objective  Blood pressure 132/82, pulse 73, height 5\' 5"  (1.651 m), weight 176 lb (79.8 kg), SpO2 98 %.   General: No apparent distress alert and oriented x3 mood and affect normal, dressed appropriately.  HEENT: Pupils equal, extraocular movements intact  Respiratory: Patient's speak in full sentences and does not appear short of breath  Cardiovascular: No lower extremity edema, non tender, no erythema  Left hip exam still has some numbness to palpation over the greater trochanteric area.  Negative straight leg test noted today.  Mild pain over the sacroiliac joint.   After verbal consent patient was prepped with alcohol swab and with a 21-gauge 2 inch needle injected into the left greater trochanteric area with 2 cc of 0.5% Marcaine and 1 cc of Kenalog 40 mg/mL.  No blood loss.  Band-Aid placed.  Postinjection instructions given   Impression and Recommendations:    The above documentation has been reviewed and is accurate and complete Judi Saa, DO

## 2022-12-11 ENCOUNTER — Ambulatory Visit (INDEPENDENT_AMBULATORY_CARE_PROVIDER_SITE_OTHER): Payer: PPO | Admitting: Family Medicine

## 2022-12-11 ENCOUNTER — Encounter: Payer: Self-pay | Admitting: Family Medicine

## 2022-12-11 VITALS — BP 132/82 | HR 73 | Ht 65.0 in | Wt 176.0 lb

## 2022-12-11 DIAGNOSIS — M2550 Pain in unspecified joint: Secondary | ICD-10-CM | POA: Diagnosis not present

## 2022-12-11 DIAGNOSIS — M7062 Trochanteric bursitis, left hip: Secondary | ICD-10-CM

## 2022-12-11 DIAGNOSIS — R252 Cramp and spasm: Secondary | ICD-10-CM

## 2022-12-11 LAB — URIC ACID: Uric Acid, Serum: 4.6 mg/dL (ref 2.4–7.0)

## 2022-12-11 LAB — IBC PANEL
Iron: 113 ug/dL (ref 42–145)
Saturation Ratios: 40.6 % (ref 20.0–50.0)
TIBC: 278.6 ug/dL (ref 250.0–450.0)
Transferrin: 199 mg/dL — ABNORMAL LOW (ref 212.0–360.0)

## 2022-12-11 LAB — COMPREHENSIVE METABOLIC PANEL
ALT: 22 U/L (ref 0–35)
AST: 20 U/L (ref 0–37)
Albumin: 4.5 g/dL (ref 3.5–5.2)
Alkaline Phosphatase: 46 U/L (ref 39–117)
BUN: 25 mg/dL — ABNORMAL HIGH (ref 6–23)
CO2: 28 mEq/L (ref 19–32)
Calcium: 9.6 mg/dL (ref 8.4–10.5)
Chloride: 104 mEq/L (ref 96–112)
Creatinine, Ser: 0.91 mg/dL (ref 0.40–1.20)
GFR: 63.13 mL/min (ref >60.00)
Glucose, Bld: 107 mg/dL — ABNORMAL HIGH (ref 70–99)
Potassium: 4.7 mEq/L (ref 3.5–5.1)
Sodium: 140 mEq/L (ref 135–145)
Total Bilirubin: 0.8 mg/dL (ref 0.2–1.2)
Total Protein: 6.7 g/dL (ref 6.0–8.3)

## 2022-12-11 LAB — VITAMIN B12: Vitamin B-12: 173 pg/mL — ABNORMAL LOW (ref 211–911)

## 2022-12-11 LAB — FOLATE: Folate: 15.4 ng/mL (ref >5.9)

## 2022-12-11 LAB — FERRITIN: Ferritin: 185.8 ng/mL (ref 10.0–291.0)

## 2022-12-11 LAB — SEDIMENTATION RATE: Sed Rate: 7 mm/hr (ref 0–30)

## 2022-12-11 LAB — VITAMIN D 25 HYDROXY (VIT D DEFICIENCY, FRACTURES): VITD: 23.57 ng/mL — ABNORMAL LOW (ref 30.00–100.00)

## 2022-12-11 NOTE — Assessment & Plan Note (Signed)
Patient has had muscle cramping for quite some time.  We discussed with patient that there is a possibility for deficiency that could be causing it, patient would like to get laboratory workup to see if anything is contributing, and is not always at night so I do not think that sleep apnea is the cause at this time.  Discussed with patient otherwise to continue to monitor and see if there is any other triggers.  If necessary we may need neurology if this continues.

## 2022-12-11 NOTE — Assessment & Plan Note (Signed)
Repeat injection given today, tolerated the procedure well, discussed icing regimen and home exercises, discussed which activities to do and which ones to avoid.  Increase activity slowly.  If no significant improvement this time I do think that advanced imaging will be warranted

## 2022-12-11 NOTE — Patient Instructions (Addendum)
GT injection Labs today In 2 weeks if not better will get MRI See me in 3 months otherwise

## 2022-12-12 ENCOUNTER — Encounter: Payer: Self-pay | Admitting: Family Medicine

## 2022-12-12 ENCOUNTER — Other Ambulatory Visit: Payer: Self-pay

## 2022-12-12 MED ORDER — VITAMIN D (ERGOCALCIFEROL) 1.25 MG (50000 UNIT) PO CAPS: 50000.0000 [IU] | ORAL_CAPSULE | ORAL | 0 refills | Status: DC

## 2022-12-12 MED ORDER — GABAPENTIN 100 MG PO CAPS: 200.0000 mg | ORAL_CAPSULE | Freq: Every day | ORAL | 0 refills | Status: DC

## 2022-12-13 LAB — PTH, INTACT AND CALCIUM
Calcium: 9.8 mg/dL (ref 8.6–10.4)
PTH: 44 pg/mL (ref 16–77)

## 2022-12-18 ENCOUNTER — Encounter: Payer: Self-pay | Admitting: Obstetrics & Gynecology

## 2022-12-26 ENCOUNTER — Ambulatory Visit: Payer: PPO | Admitting: Obstetrics & Gynecology

## 2022-12-31 ENCOUNTER — Telehealth: Payer: Self-pay | Admitting: Internal Medicine

## 2022-12-31 NOTE — Telephone Encounter (Signed)
Contacted Megan Romero to schedule their annual wellness visit. Patient declined to schedule AWV at this time.  [facility] Care Guide [facility] AWV TEAM Direct Dial: [phone]
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
## 2023-01-02 ENCOUNTER — Ambulatory Visit (INDEPENDENT_AMBULATORY_CARE_PROVIDER_SITE_OTHER): Payer: PPO

## 2023-01-02 ENCOUNTER — Ambulatory Visit: Payer: Self-pay

## 2023-01-02 ENCOUNTER — Ambulatory Visit: Payer: PPO | Admitting: Family Medicine

## 2023-01-02 ENCOUNTER — Encounter: Payer: Self-pay | Admitting: Family Medicine

## 2023-01-02 VITALS — BP 128/82 | HR 87 | Ht 65.0 in | Wt 171.0 lb

## 2023-01-02 DIAGNOSIS — M25562 Pain in left knee: Secondary | ICD-10-CM

## 2023-01-02 DIAGNOSIS — S8012XA Contusion of left lower leg, initial encounter: Secondary | ICD-10-CM | POA: Insufficient documentation

## 2023-01-02 NOTE — Assessment & Plan Note (Signed)
Large hematoma on the anterior medial aspect of the knee.  Seems to be over the tibial plateau.  Patient walks without any significant limp.  Good full flexion and extension.  We did discuss on ultrasound there is enough fluid and therefore aspiration.  Patient declined.  Discussed icing regimen when more heating as well as massage.  Arnica lotion as well.  Follow-up again 2 to 4 weeks.  If continuing to have fluid accumulation would consider aspiration.  X-rays ordered to rule out any occult fracture but highly unlikely based on patient being able to walk well.

## 2023-01-02 NOTE — Patient Instructions (Signed)
Heat and massage 20 min 2-3 x a day Arnica lotion 2-3 x a day Xray on way out See me in 3-4 weeks

## 2023-01-29 NOTE — Progress Notes (Unsigned)
[name] [address] [place] [address] Phone: [phone] Subjective:   Megan Romero, am serving as a scribe for Dr. Antoine Primas.  I'm seeing this patient by the request  of:  Myrlene Broker, MD  CC: Left knee pain, left hip pain  [id number]  01/02/2023 Large hematoma on the anterior medial aspect of the knee.  Seems to be over the tibial plateau.  Patient walks without any significant limp.  Good full flexion and extension.  We did discuss on ultrasound there is enough fluid and therefore aspiration.  Patient declined.  Discussed icing regimen when more heating as well as massage.  Arnica lotion as well.  Follow-up again 2 to 4 weeks.  If continuing to have fluid accumulation would consider aspiration.  X-rays ordered to rule out any occult fracture but highly unlikely based on patient being able to walk well.      Update 01/30/2023 Megan Romero is a 72 y.o. female coming in with complaint of L knee pain. Patient states that her knee swelling has not subsided. Using heat.   Patient also c/o L hip pain. Played golf 2 days in a row and her hip is now flared up. Using ice, frankincense, and Arnica over the GT. Is hesitant about MRI.        Past Medical History:  Diagnosis Date   B12 deficiency 04/28/2019   Degenerative disc disease, lumbar 02/08/2020   Depression    Hypothyroidism 05/26/2008   Loss of transverse plantar arch of right foot 10/01/2016   Thyroid disease    Hypothyroidism   Past Surgical History:  Procedure Laterality Date   ABDOMINAL HYSTERECTOMY     CARDIAC ELECTROPHYSIOLOGY MAPPING AND ABLATION     CARPAL TUNNEL RELEASE     b/l   Social History   Socioeconomic History   Marital status: Widowed    Spouse name: Not on file   Number of children: Not on file   Years of education: Not on file   Highest education level: Not on file  Occupational History   Not on file  Tobacco Use   Smoking status:  Former    Types: Cigarettes    Quit date: 02/11/2017    Years since quitting: 5.9   Smokeless tobacco: Never  Vaping Use   Vaping Use: Never used  Substance and Sexual Activity   Alcohol use: Yes   Drug use: No   Sexual activity: Not Currently    Partners: Male  Other Topics Concern   Not on file  Social History Narrative   Not on file   Social Determinants of Health   Financial Resource Strain: Not on file  Food Insecurity: Not on file  Transportation Needs: Not on file  Physical Activity: Not on file  Stress: Not on file  Social Connections: Not on file   Allergies  Allergen Reactions   Penicillins    Family History  Problem Relation Age of Onset   Heart disease Mother        mvp   Alzheimer's disease Father     Current Outpatient Medications (Endocrine & Metabolic):    levothyroxine (SYNTHROID) 175 MCG tablet, Take 1 tablet (175 mcg total) by mouth daily.    Current Outpatient Medications (Analgesics):    aspirin 81 MG tablet, Take 81 mg by mouth daily.   meloxicam (MOBIC) 15 MG tablet, Take 1 tablet (15 mg total) by mouth daily.   Current Outpatient Medications (Other):  Vitamin D, Ergocalciferol, (DRISDOL) 1.25 MG (50000 UNIT) CAPS capsule, Take 1 capsule (50,000 Units total) by mouth every 7 (seven) days.   gabapentin (NEURONTIN) 100 MG capsule, Take 2 capsules (200 mg total) by mouth at bedtime.   Reviewed prior external information including notes and imaging from  primary care provider As well as notes that were available from care everywhere and other healthcare systems.  Past medical history, social, surgical and family history all reviewed in electronic medical record.  No pertanent information unless stated regarding to the chief complaint.   Review of Systems:  No headache, visual changes, nausea, vomiting, diarrhea, constipation, dizziness, abdominal pain, skin rash, fevers, chills, night sweats, weight loss, swollen lymph nodes, body aches,  joint swelling, chest pain, shortness of breath, mood changes. POSITIVE muscle aches  Objective  Blood pressure 120/80, pulse 76, height 5\' 5"  (1.651 m), weight 175 lb (79.4 kg), SpO2 97 %.   General: No apparent distress alert and oriented x3 mood and affect normal, dressed appropriately.  HEENT: Pupils equal, extraocular movements intact  Respiratory: Patient's speak in full sentences and does not appear short of breath  Cardiovascular: No lower extremity edema, non tender, no erythema  Left knee has a hematoma that does seem to be resolving.  Minorly tender to palpation on the medial aspect of the knee. Positive straight leg test noted and is worse than previous exam on the left leg.  Severe tenderness still noted over the gluteal area and the greater trochanteric area on the left side.  Patient does have increasing tenderness noted in the lower back.  Seems to have some radicular symptoms with the straight leg test as well as 25 degrees of forward flexion.    Impression and Recommendations:    The above documentation has been reviewed and is accurate and complete Judi Saa, DO

## 2023-01-30 ENCOUNTER — Ambulatory Visit (INDEPENDENT_AMBULATORY_CARE_PROVIDER_SITE_OTHER): Payer: PPO | Admitting: Family Medicine

## 2023-01-30 VITALS — BP 120/80 | HR 76 | Ht 65.0 in | Wt 175.0 lb

## 2023-01-30 DIAGNOSIS — M5136 Other intervertebral disc degeneration, lumbar region: Secondary | ICD-10-CM | POA: Diagnosis not present

## 2023-01-30 DIAGNOSIS — M7062 Trochanteric bursitis, left hip: Secondary | ICD-10-CM | POA: Diagnosis not present

## 2023-01-30 DIAGNOSIS — M545 Low back pain, unspecified: Secondary | ICD-10-CM

## 2023-01-30 NOTE — Patient Instructions (Addendum)
MRI lumbar spine  MRI L hip  [phone]

## 2023-01-30 NOTE — Assessment & Plan Note (Signed)
Patient is having significant tightness noted at this time.  Discussed that patient is not having any improvement with the radicular pain at the.  Would like to further follow-up.  She will need imaging at this time.  Patient has failed all conservative therapy including formal physical therapy, injections oral medications and topical entire medications.  Affecting daily activities as well as sleep.  Will get MRI of the lumbar spine and the left hip to further evaluate why patient continues to have the difficulty.  Follow-up with me again in 6 to 8 weeks otherwise.

## 2023-03-12 ENCOUNTER — Ambulatory Visit: Payer: PPO | Admitting: Family Medicine

## 2023-03-12 ENCOUNTER — Ambulatory Visit
Admission: RE | Admit: 2023-03-12 | Discharge: 2023-03-12 | Disposition: A | Discharge status: Home / Self Care | Payer: PPO | Source: Ambulatory Visit | Attending: Family Medicine | Admitting: Family Medicine

## 2023-03-12 DIAGNOSIS — M545 Low back pain, unspecified: Secondary | ICD-10-CM

## 2023-03-12 DIAGNOSIS — M25552 Pain in left hip: Secondary | ICD-10-CM | POA: Diagnosis not present

## 2023-03-12 DIAGNOSIS — M160 Bilateral primary osteoarthritis of hip: Secondary | ICD-10-CM | POA: Diagnosis not present

## 2023-03-12 DIAGNOSIS — M7062 Trochanteric bursitis, left hip: Secondary | ICD-10-CM

## 2023-03-12 DIAGNOSIS — M5416 Radiculopathy, lumbar region: Secondary | ICD-10-CM | POA: Diagnosis not present

## 2023-03-19 ENCOUNTER — Encounter: Payer: Self-pay | Admitting: Family Medicine

## 2023-03-20 ENCOUNTER — Other Ambulatory Visit: Payer: Self-pay

## 2023-03-20 DIAGNOSIS — M5416 Radiculopathy, lumbar region: Secondary | ICD-10-CM

## 2023-03-27 NOTE — Progress Notes (Signed)
  [name] [address] [place] [address] Phone: [phone] Subjective:    I'm seeing this patient by the request  of:  Myrlene Broker, MD  CC: Left hip pain follow-up  [id number]  Megan Romero is a 72 y.o. female coming in with complaint of L hip pain. Here for PRP for hamstring. Patient states that she has been doing ok as she has not been golfing due to rain.       Past Medical History:  Diagnosis Date   B12 deficiency 04/28/2019   Degenerative disc disease, lumbar 02/08/2020   Depression    Hypothyroidism 05/26/2008   Loss of transverse plantar arch of right foot 10/01/2016   Thyroid disease    Hypothyroidism   Past Surgical History:  Procedure Laterality Date   ABDOMINAL HYSTERECTOMY     CARDIAC ELECTROPHYSIOLOGY MAPPING AND ABLATION     CARPAL TUNNEL RELEASE     b/l   Social History   Socioeconomic History   Marital status: Widowed    Spouse name: Not on file   Number of children: Not on file   Years of education: Not on file   Highest education level: Not on file  Occupational History   Not on file  Tobacco Use   Smoking status: Former    Current packs/day: 0.00    Types: Cigarettes    Quit date: 02/11/2017    Years since quitting: 6.1   Smokeless tobacco: Never  Vaping Use   Vaping status: Never Used  Substance and Sexual Activity   Alcohol use: Yes   Drug use: No   Sexual activity: Not Currently    Partners: Male  Other Topics Concern   Not on file  Social History Narrative   Not on file   Social Determinants of Health   Financial Resource Strain: Not on file  Food Insecurity: Not on file  Transportation Needs: Not on file  Physical Activity: Not on file  Stress: Not on file  Social Connections: Not on file   Allergies  Allergen Reactions   Penicillins    Family History  Problem Relation Age of Onset   Heart disease Mother        mvp   Alzheimer's disease Father     Objective   Blood pressure 112/78, pulse 87, height 5\' 5"  (1.651 m), weight 173 lb (78.5 kg), SpO2 97%.   General: No apparent distress alert and oriented x3 mood and affect normal, dressed appropriately.   Procedure: Real-time Ultrasound Guided Injection of left gluteal tendon sheath Device: GE Logiq Q7 Ultrasound guided injection is preferred based studies that show increased duration, increased effect, greater accuracy, decreased procedural pain, increased response rate, and decreased cost with ultrasound guided versus blind injection.  Verbal informed consent obtained.  Time-out conducted.  Noted no overlying erythema, induration, or other signs of local infection.  Skin prepped in a sterile fashion.  Local anesthesia: Topical Ethyl chloride.  With sterile technique and under real time ultrasound guidance: With a 21-gauge*injected with 0.5 cc of 0.5% Marcaine then injected with 4 cc of PRP leukocyte rich. Completed without difficulty  Advised to call if fevers/chills, erythema, induration, drainage, or persistent bleeding.  Impression: Technically successful ultrasound guided injection.    Impression and Recommendations:      The above documentation has been reviewed and is accurate and complete Judi Saa, DO

## 2023-03-28 ENCOUNTER — Encounter: Payer: Self-pay | Admitting: Family Medicine

## 2023-03-28 ENCOUNTER — Ambulatory Visit (INDEPENDENT_AMBULATORY_CARE_PROVIDER_SITE_OTHER): Payer: Self-pay | Admitting: Family Medicine

## 2023-03-28 ENCOUNTER — Other Ambulatory Visit: Payer: Self-pay

## 2023-03-28 VITALS — BP 112/78 | HR 87 | Ht 65.0 in | Wt 173.0 lb

## 2023-03-28 DIAGNOSIS — M25552 Pain in left hip: Secondary | ICD-10-CM

## 2023-03-28 DIAGNOSIS — M7602 Gluteal tendinitis, left hip: Secondary | ICD-10-CM

## 2023-03-28 NOTE — Assessment & Plan Note (Signed)
Injection in this area and tolerated the procedure well.  Discussed PRP post injection care.  Follow-up again in 6 to 8 weeks

## 2023-03-28 NOTE — Patient Instructions (Signed)
No ice or IBU  Heat and Tylenol are ok See me again in 6 weeks

## 2023-04-02 ENCOUNTER — Encounter (INDEPENDENT_AMBULATORY_CARE_PROVIDER_SITE_OTHER): Payer: Self-pay

## 2023-04-07 ENCOUNTER — Encounter: Payer: Self-pay | Admitting: Internal Medicine

## 2023-04-07 ENCOUNTER — Ambulatory Visit
Admission: RE | Admit: 2023-04-07 | Discharge: 2023-04-07 | Disposition: A | Discharge status: Home / Self Care | Payer: PPO | Source: Ambulatory Visit | Attending: Family Medicine | Admitting: Family Medicine

## 2023-04-07 DIAGNOSIS — G8929 Other chronic pain: Secondary | ICD-10-CM | POA: Diagnosis not present

## 2023-04-07 DIAGNOSIS — M25552 Pain in left hip: Secondary | ICD-10-CM | POA: Diagnosis not present

## 2023-04-07 DIAGNOSIS — M5416 Radiculopathy, lumbar region: Secondary | ICD-10-CM

## 2023-04-07 DIAGNOSIS — M545 Low back pain, unspecified: Secondary | ICD-10-CM | POA: Diagnosis not present

## 2023-04-07 MED ORDER — LEVOTHYROXINE SODIUM 175 MCG PO TABS: 175.0000 ug | ORAL_TABLET | Freq: Every day | ORAL | 0 refills | Status: DC

## 2023-04-07 MED ORDER — IOPAMIDOL (ISOVUE-M 200) INJECTION 41%
1.0000 mL | Freq: Once | INTRAMUSCULAR | Status: AC
  Administered 2023-04-07: 1 mL via EPIDURAL

## 2023-04-07 MED ORDER — METHYLPREDNISOLONE ACETATE 40 MG/ML INJ SUSP (RADIOLOG
80.0000 mg | Freq: Once | INTRAMUSCULAR | Status: AC
  Administered 2023-04-07: 80 mg via EPIDURAL

## 2023-04-07 NOTE — Discharge Instructions (Signed)

## 2023-04-16 ENCOUNTER — Ambulatory Visit: Payer: PPO | Admitting: Internal Medicine

## 2023-04-29 ENCOUNTER — Ambulatory Visit: Payer: PPO | Admitting: Internal Medicine

## 2023-04-29 DIAGNOSIS — L820 Inflamed seborrheic keratosis: Secondary | ICD-10-CM | POA: Diagnosis not present

## 2023-04-29 DIAGNOSIS — L570 Actinic keratosis: Secondary | ICD-10-CM | POA: Diagnosis not present

## 2023-05-01 ENCOUNTER — Encounter: Payer: Self-pay | Admitting: Internal Medicine

## 2023-05-01 ENCOUNTER — Telehealth (INDEPENDENT_AMBULATORY_CARE_PROVIDER_SITE_OTHER): Payer: PPO | Admitting: Family Medicine

## 2023-05-01 ENCOUNTER — Encounter: Payer: Self-pay | Admitting: Family Medicine

## 2023-05-01 VITALS — Wt 165.0 lb

## 2023-05-01 DIAGNOSIS — U071 COVID-19: Secondary | ICD-10-CM

## 2023-05-01 MED ORDER — NIRMATRELVIR/RITONAVIR (PAXLOVID)TABLET
3.0000 | ORAL_TABLET | Freq: Two times a day (BID) | ORAL | 0 refills | Status: AC
  Filled 2023-05-02: qty 30, 5d supply, fill #0

## 2023-05-01 NOTE — Patient Instructions (Addendum)
HOME CARE TIPS:   -I sent the medication(s) we discussed to your pharmacy: Meds ordered this encounter  Medications   nirmatrelvir/ritonavir (PAXLOVID) 20 x 150 MG & 10 x 100MG  TABS    Sig: Take 3 tablets by mouth 2 (two) times daily for 5 days. (Take nirmatrelvir 150 mg two tablets twice daily for 5 days and ritonavir 100 mg one tablet twice daily for 5 days) Patient GFR is 63 in April    Dispense:  30 tablet    Refill:  0     -I sent in the Covid19 treatment or referral you requested per our discussion. Please see the information provided below and discuss further with the pharmacist/treatment team.  -If taking Paxlovid, please review all medications, supplement and over the counter drugs with your pharmacist and ask them to check for any interactions.    -there is a chance of rebound illness with covid after improving. This can happen whether or not you take an antiviral treatment. If you become sick again with covid after getting better, please schedule a follow up virtual visit and isolate again.  -can use tylenol or aleve if needed for fevers, aches and pains per instructions  -nasal saline sinus rinses twice daily  -stay hydrated, drink plenty of fluids and eat small healthy meals - avoid dairy  -follow up with your doctor in 2-3 days unless improving and feeling better  -stay home while sick, except to seek medical care. If you have COVID19, you will likely be contagious for 7-10 days. Flu or Influenza is likely contagious for about 7 days. Other respiratory viral infections remain contagious for 5-10+ days depending on the virus and many other factors. Wear a good mask that fits snugly (such as N95 or KN95) if around others to reduce the risk of transmission.  It was nice to meet you today, and I really hope you are feeling better soon. I help [place] out with telemedicine visits on Tuesdays and Thursdays and am happy to help if you need a follow up virtual visit on those  days. Otherwise, if you have any concerns or questions following this visit please schedule a follow up visit with your Primary Care doctor or seek care at a local urgent care clinic to avoid delays in care.    Seek in person care or schedule a follow up video visit promptly if your symptoms worsen, new concerns arise or you are not improving with treatment. Call 911 and/or seek emergency care if your symptoms are severe or life threatening.  Nirmatrelvir; Ritonavir Tablets What is this medication? NIRMATRELVIR; RITONAVIR (NIR ma TREL vir; ri TOE na veer) treats mild to moderate COVID-19. It may help people who are at high risk of developing severe illness. It works by limiting the spread of the virus in your body. This medicine may be used for other purposes; ask your health care provider or pharmacist if you have questions. COMMON BRAND NAME(S): PAXLOVID What should I tell my care team before I take this medication? They need to know if you have any of these conditions: Any allergies Any serious illness Kidney disease Liver disease An unusual or allergic reaction to nirmatrelvir, ritonavir, other medications, foods, dyes, or preservatives Pregnant or trying to get pregnant Breast-feeding How should I use this medication? This product contains 2 different medications that are packaged together. For the standard dose, take 2 pink tablets of nirmatrelvir with 1 white tablet of ritonavir (3 tablets total) by mouth with water twice daily.  Talk to your care team if you have kidney disease. You may need a different dose. Swallow the tablets whole. You can take it with or without food. If it upsets your stomach, take it with food. Take all of this medication unless your care team tells you to stop it early. Keep taking it even if you think you are better. Talk to your care team about the use of this medication in children. While it may be prescribed for children as young as 12 years for selected  conditions, precautions do apply. Overdosage: If you think you have taken too much of this medicine contact a poison control center or emergency room at once. NOTE: This medicine is only for you. Do not share this medicine with others. What if I miss a dose? If you miss a dose, take it as soon as you can unless it is more than 8 hours late. If it is more than 8 hours late, skip the missed dose. Take the next dose at the normal time. Do not take extra or 2 doses at the same time to make up for the missed dose. What may interact with this medication? Do not take this medication with any of the following: Alfuzosin Certain medications for anxiety or sleep, such as midazolam or triazolam Certain medications for cancer, such as apalutamide Certain medications for cholesterol, such as lovastatin or simvastatin Certain medications for irregular heartbeat, such as amiodarone, dronedarone, flecainide, propafenone, quinidine Certain medications for mental health conditions, such as lurasidone or pimozide Certain medications for seizures, such as carbamazepine, phenobarbital, phenytoin, primidone Colchicine Eletriptan Eplerenone Ergot alkaloids, such as dihydroergotamine, ergotamine, methylergonovine Finerenone Flibanserin Ivabradine Lomitapide Lumacaftor; ivacaftor Naloxegol Ranolazine Red Yeast Rice Rifampin Rifapentine Sildenafil Silodosin [facility] John's wort Tolvaptan Ubrogepant Voclosporin This medication may affect how other medications work, and other medications may affect the way this medication works. Talk with your care team about all of the medications you take. They may suggest changes to your treatment plan to lower the risk of side effects and to make sure your medications work as intended. This list may not describe all possible interactions. Give your health care provider a list of all the medicines, herbs, non-prescription drugs, or dietary supplements you use. Also tell them if  you smoke, drink alcohol, or use illegal drugs. Some items may interact with your medicine. What should I watch for while using this medication? Your condition will be monitored carefully while you are receiving this medication. Visit your care team for regular checkups. Tell your care team if your symptoms do not start to get better or if they get worse. If you have untreated HIV infection, this medication may lead to some HIV medications not working as well in the future. Estrogen and progestin hormones may not work as well while you are taking this medication. Your care team can help you find the contraceptive option that works for you. What side effects may I notice from receiving this medication? Side effects that you should report to your care team as soon as possible: Allergic reactions--skin rash, itching, hives, swelling of the face, lips, tongue, or throat Liver injury--right upper belly pain, loss of appetite, nausea, light-colored stool, dark yellow or brown urine, yellowing skin or eyes, unusual weakness or fatigue Redness, blistering, peeling, or loosening of the skin, including inside the mouth Side effects that usually do not require medical attention (report these to your care team if they continue or are bothersome): Change in taste Diarrhea General discomfort and  fatigue Increase in blood pressure Muscle pain Nausea Stomach pain This list may not describe all possible side effects. Call your doctor for medical advice about side effects. You may report side effects to FDA at 1-800-FDA-1088. Where should I keep my medication? Keep out of the reach of children and pets. Store at room temperature between 20 and 25 degrees C (68 and 77 degrees F). Get rid of any unused medication after the expiration date. To get rid of medications that are no longer needed or have expired: Take the medication to a medication take-back program. Check with your pharmacy or law enforcement to find  a location. If you cannot return the medication, check the label or package insert to see if the medication should be thrown out in the garbage or flushed down the toilet. If you are not sure, ask your care team. If it is safe to put it in the trash, take the medication out of the container. Mix the medication with cat litter, dirt, coffee grounds, or other unwanted substance. Seal the mixture in a bag or container. Put it in the trash. NOTE: This sheet is a summary. It may not cover all possible information. If you have questions about this medicine, talk to your doctor, pharmacist, or health care provider.  2024 Elsevier/Gold Standard (2022-10-07 00:00:00)

## 2023-05-01 NOTE — Progress Notes (Signed)
Virtual Visit via Video Note  I connected with Megan Romero  on 05/01/23 at  3:00 PM EDT by a video enabled telemedicine application and verified that I am speaking with the correct person using two identifiers.  Location patient: [place] Location provider:work or home office Persons participating in the virtual visit: patient, provider  I discussed the limitations and requested verbal permission for telemedicine visit. The patient expressed understanding and agreed to proceed.   HPI:  Acute telemedicine visit for Megan Romero: -Onset: yesterday, tested positive -Symptoms include: sore throat, headache, nausea, congestion, cough, fever, body aches -Denies:CP, SOB, vomiting, diarrhea -drinking fluids, tolerating oral intake of fluids -Pertinent past medical history: see below, has had covid in the past, GFR was 63 a this year -Pertinent medication allergies: Allergies  Allergen Reactions   Penicillins   -COVID-19 vaccine status: has not had updated booster Immunization History  Administered Date(s) Administered   Fluad Quad(high Dose 65+) 07/22/2019   Influenza Split 05/30/2011   Influenza Whole 05/26/2008, 06/14/2009   Influenza, High Dose Seasonal PF 06/20/2016, 05/23/2017, 07/01/2018   Influenza-Unspecified 08/21/2015   PFIZER(Purple Top)SARS-COV-2 Vaccination 11/25/2019, 12/20/2019   Pneumococcal Conjugate-13 07/01/2018   Pneumococcal Polysaccharide-23 09/17/2011, 06/20/2016   Td 03/18/2001   Tdap 05/27/2011   Zoster Recombinant(Shingrix) 07/22/2019   Zoster, Live 05/27/2011     ROS: See pertinent positives and negatives per HPI.  Past Medical History:  Diagnosis Date   B12 deficiency 04/28/2019   Degenerative disc disease, lumbar 02/08/2020   Depression    Hypothyroidism 05/26/2008   Loss of transverse plantar arch of right foot 10/01/2016   Thyroid disease    Hypothyroidism    Past Surgical History:  Procedure Laterality Date   ABDOMINAL HYSTERECTOMY     CARDIAC  ELECTROPHYSIOLOGY MAPPING AND ABLATION     CARPAL TUNNEL RELEASE     b/l     Current Outpatient Medications:    aspirin 81 MG tablet, Take 81 mg by mouth daily., Disp: , Rfl:    levothyroxine (SYNTHROID) 175 MCG tablet, Take 1 tablet (175 mcg total) by mouth daily. Must keep 04/16/23 appt for refills, Disp: 30 tablet, Rfl: 0   nirmatrelvir/ritonavir (PAXLOVID) 20 x 150 MG & 10 x 100MG  TABS, Take 3 tablets by mouth 2 (two) times daily for 5 days. (Take nirmatrelvir 150 mg two tablets twice daily for 5 days and ritonavir 100 mg one tablet twice daily for 5 days) Patient GFR is 63 in April, Disp: 30 tablet, Rfl: 0  EXAM:  VITALS per patient if applicable:  GENERAL: alert, oriented, appears well and in no acute distress  HEENT: atraumatic, conjunttiva clear, no obvious abnormalities on inspection of external nose and ears  NECK: normal movements of the head and neck  LUNGS: on inspection no signs of respiratory distress, breathing rate appears normal, no obvious gross SOB, gasping or wheezing  CV: no obvious cyanosis  MS: moves all visible extremities without noticeable abnormality  PSYCH/NEURO: pleasant and cooperative, no obvious depression or anxiety, speech and thought processing grossly intact  ASSESSMENT AND PLAN:  Discussed the following assessment and plan:  COVID-19   Discussed treatment options, side effect and risk of drug interactions, ideal treatment window, potential complications, isolation and precautions for COVID-19.  Checked for/reviewed last GFR - listed in HPI if available.  After lengthy discussion, the patient opted for treatment with Paclovid due to being higher risk for complications of covid or severe disease and other factors. Discussed side effects, alternatives, interactions if applicable. Provided further information in  patient instructions.  Other symptomatic care measures summarized in patient instructions.  Advised to seek prompt virtual visit or in  person care if worsening, new symptoms arise, or if is not improving with treatment as expected per our conversation of expected course. Discussed options for follow up care. Did let this patient know that I do telemedicine on Tuesdays and Thursdays for [place] [facility] and those are the days I am logged into the system. Advised to schedule follow up visit with PCP, [place] virtual visits or [facility] if any further questions or concerns to avoid delays in care.   I discussed the assessment and treatment plan with the patient. The patient was provided an opportunity to ask questions and all were answered. The patient agreed with the plan and demonstrated an understanding of the instructions.     Terressa Koyanagi, DO

## 2023-05-02 ENCOUNTER — Other Ambulatory Visit (HOSPITAL_BASED_OUTPATIENT_CLINIC_OR_DEPARTMENT_OTHER): Payer: Self-pay

## 2023-05-12 ENCOUNTER — Encounter: Payer: Self-pay | Admitting: Internal Medicine

## 2023-05-12 NOTE — Telephone Encounter (Signed)
FYI

## 2023-05-12 NOTE — Progress Notes (Unsigned)
[facility] [address] [place] [address] Phone: [phone] Subjective:   Megan Romero, am serving as a scribe for Dr. Antoine Primas.  I'm seeing this patient by the request  of:  Myrlene Broker, MD  CC: Back exam does have loss of lordosis and continued pain in the gluteal area  [id number]  03/28/2023 Injection in this area and tolerated the procedure well.  Discussed PRP post injection care.  Follow-up again in 6 to 8 weeks      Update 05/13/2023 Megan Romero is a 72 y.o. female coming in with complaint of L hip pain. Patient states having good and bad days. Was expecting more. Initially felt better. No other concerns. Had epidural helped for about 10-14 days. Nothing is lasting.      Past Medical History:  Diagnosis Date   B12 deficiency 04/28/2019   Degenerative disc disease, lumbar 02/08/2020   Depression    Hypothyroidism 05/26/2008   Loss of transverse plantar arch of right foot 10/01/2016   Thyroid disease    Hypothyroidism   Past Surgical History:  Procedure Laterality Date   ABDOMINAL HYSTERECTOMY     CARDIAC ELECTROPHYSIOLOGY MAPPING AND ABLATION     CARPAL TUNNEL RELEASE     b/l   Social History   Socioeconomic History   Marital status: Widowed    Spouse name: Not on file   Number of children: Not on file   Years of education: Not on file   Highest education level: Associate degree: academic program  Occupational History   Not on file  Tobacco Use   Smoking status: Former    Current packs/day: 0.00    Types: Cigarettes    Quit date: 02/11/2017    Years since quitting: 6.2   Smokeless tobacco: Never  Vaping Use   Vaping status: Never Used  Substance and Sexual Activity   Alcohol use: Yes   Drug use: No   Sexual activity: Not Currently    Partners: Male  Other Topics Concern   Not on file  Social History Narrative   Not on file   Social Determinants of Health   Financial Resource  Strain: Low Risk  (04/28/2023)   Overall Financial Resource Strain (CARDIA)    Difficulty of Paying Living Expenses: Not hard at all  Food Insecurity: No Food Insecurity (04/28/2023)   Hunger Vital Sign    Worried About Running Out of Food in the Last Year: Never true    Ran Out of Food in the Last Year: Never true  Transportation Needs: No Transportation Needs (04/28/2023)   PRAPARE - Administrator, Civil Service (Medical): No    Lack of Transportation (Non-Medical): No  Physical Activity: Sufficiently Active (04/28/2023)   Exercise Vital Sign    Days of Exercise per Week: 7 days    Minutes of Exercise per Session: 40 min  Stress: No Stress Concern Present (04/28/2023)   [employer] of Occupational Health - Occupational Stress Questionnaire    Feeling of Stress : Not at all  Social Connections: Moderately Integrated (04/28/2023)   Social Connection and Isolation Panel [NHANES]    Frequency of Communication with Friends and Family: Once a week    Frequency of Social Gatherings with Friends and Family: More than three times a week    Attends Religious Services: More than 4 times per year    Active Member of [employer] or Organizations: Yes    Attends Banker  Meetings: More than 4 times per year    Marital Status: Widowed   Allergies  Allergen Reactions   Penicillins    Family History  Problem Relation Age of Onset   Heart disease Mother        mvp   Alzheimer's disease Father     Current Outpatient Medications (Endocrine & Metabolic):    levothyroxine (SYNTHROID) 175 MCG tablet, Take 1 tablet (175 mcg total) by mouth daily. Must keep 04/16/23 appt for refills    Current Outpatient Medications (Analgesics):    aspirin 81 MG tablet, Take 81 mg by mouth daily.    Review of Systems:  No headache, visual changes, nausea, vomiting, diarrhea, constipation, dizziness, abdominal pain, skin rash, fevers, chills, night sweats, weight loss, swollen lymph nodes,  body aches, joint swelling, chest pain, shortness of breath, mood changes. POSITIVE muscle aches  Objective  Blood pressure 116/70, pulse 81, height 5\' 5"  (1.651 m), weight 165 lb (74.8 kg), SpO2 96%.   General: No apparent distress alert and oriented x3 mood and affect normal, dressed appropriately.  HEENT: Pupils equal, extraocular movements intact  Respiratory: Patient's speak in full sentences and does not appear short of breath  Cardiovascular: No lower extremity edema, non tender, no erythema  Patient is sitting more comfortably at the moment.  Still some mild tenderness on the gluteal area but no improvement noted.  Improvement in strength also noted.  Negative straight leg test at the moment.    Impression and Recommendations:     The above documentation has been reviewed and is accurate and complete Judi Saa, DO

## 2023-05-13 ENCOUNTER — Other Ambulatory Visit: Payer: Self-pay

## 2023-05-13 ENCOUNTER — Ambulatory Visit: Payer: PPO | Admitting: Family Medicine

## 2023-05-13 ENCOUNTER — Encounter: Payer: Self-pay | Admitting: Family Medicine

## 2023-05-13 VITALS — BP 116/70 | HR 81 | Ht 65.0 in | Wt 165.0 lb

## 2023-05-13 DIAGNOSIS — E039 Hypothyroidism, unspecified: Secondary | ICD-10-CM

## 2023-05-13 DIAGNOSIS — M5136 Other intervertebral disc degeneration, lumbar region: Secondary | ICD-10-CM

## 2023-05-13 DIAGNOSIS — M7602 Gluteal tendinitis, left hip: Secondary | ICD-10-CM

## 2023-05-13 MED ORDER — LEVOTHYROXINE SODIUM 175 MCG PO TABS
175.0000 ug | ORAL_TABLET | Freq: Every day | ORAL | 0 refills | Status: DC
Start: 2023-05-13 — End: 2023-05-29

## 2023-05-13 NOTE — Assessment & Plan Note (Signed)
Still feel that this could be contributing to some of the discomfort and pain as well.  Discussed with patient about icing regimen and home exercises.  Discussed which activities to do and which ones to avoid.  Discussed potentially repeating the injection but patient states that she did have significant anxiety and would like to avoid another injection if possible.  Increase strengthening exercises and activity where patient can progress.  Follow-up with me again in 6 to 8 weeks otherwise.

## 2023-05-13 NOTE — Patient Instructions (Signed)
Read about Cymbalta We could repeat Epidural, but understand why you would want to wait See you again in 10-12 weeks

## 2023-05-13 NOTE — Assessment & Plan Note (Signed)
Patient strength is better at the moment.  Sitting more comfortably as well on exam today.  I am optimistic that patient will be potentially continuing to improve.  We did discuss that the back could be also playing a potential role.  Discussed which activities to do and which ones to avoid.  Increase activity slowly otherwise.  Follow-up with me again 2 to 3 months to further evaluate.

## 2023-05-14 ENCOUNTER — Ambulatory Visit: Payer: PPO | Admitting: Internal Medicine

## 2023-05-29 ENCOUNTER — Ambulatory Visit: Payer: PPO | Admitting: Internal Medicine

## 2023-05-29 ENCOUNTER — Encounter: Payer: Self-pay | Admitting: Internal Medicine

## 2023-05-29 VITALS — BP 116/80 | HR 86 | Temp 98.5°F | Ht 65.0 in | Wt 166.0 lb

## 2023-05-29 DIAGNOSIS — Z0000 Encounter for general adult medical examination without abnormal findings: Secondary | ICD-10-CM

## 2023-05-29 DIAGNOSIS — Z23 Encounter for immunization: Secondary | ICD-10-CM | POA: Diagnosis not present

## 2023-05-29 DIAGNOSIS — E538 Deficiency of other specified B group vitamins: Secondary | ICD-10-CM

## 2023-05-29 DIAGNOSIS — E039 Hypothyroidism, unspecified: Secondary | ICD-10-CM | POA: Diagnosis not present

## 2023-05-29 DIAGNOSIS — E559 Vitamin D deficiency, unspecified: Secondary | ICD-10-CM

## 2023-05-29 LAB — LIPID PANEL
Cholesterol: 171 mg/dL (ref 0–200)
HDL: 41.7 mg/dL (ref >39.00)
LDL Cholesterol: 102 mg/dL — ABNORMAL HIGH (ref 0–99)
NonHDL: 129.45
Total CHOL/HDL Ratio: 4
Triglycerides: 138 mg/dL (ref 0.0–149.0)
VLDL: 27.6 mg/dL (ref 0.0–40.0)

## 2023-05-29 LAB — COMPREHENSIVE METABOLIC PANEL
ALT: 21 U/L (ref 0–35)
AST: 19 U/L (ref 0–37)
Albumin: 4 g/dL (ref 3.5–5.2)
Alkaline Phosphatase: 36 U/L — ABNORMAL LOW (ref 39–117)
BUN: 24 mg/dL — ABNORMAL HIGH (ref 6–23)
CO2: 28 mEq/L (ref 19–32)
Calcium: 9.4 mg/dL (ref 8.4–10.5)
Chloride: 107 mEq/L (ref 96–112)
Creatinine, Ser: 0.86 mg/dL (ref 0.40–1.20)
GFR: 67.34 mL/min (ref >60.00)
Glucose, Bld: 108 mg/dL — ABNORMAL HIGH (ref 70–99)
Potassium: 4.3 mEq/L (ref 3.5–5.1)
Sodium: 141 mEq/L (ref 135–145)
Total Bilirubin: 1 mg/dL (ref 0.2–1.2)
Total Protein: 6.2 g/dL (ref 6.0–8.3)

## 2023-05-29 LAB — CBC
HCT: 43 % (ref 36.0–46.0)
Hemoglobin: 14.2 g/dL (ref 12.0–15.0)
MCHC: 33 g/dL (ref 30.0–36.0)
MCV: 95.9 fl (ref 78.0–100.0)
Platelets: 219 10*3/uL (ref 150.0–400.0)
RBC: 4.49 Mil/uL (ref 3.87–5.11)
RDW: 12.9 % (ref 11.5–15.5)
WBC: 4.7 10*3/uL (ref 4.0–10.5)

## 2023-05-29 LAB — TSH: TSH: 0.01 u[IU]/mL — ABNORMAL LOW (ref 0.35–5.50)

## 2023-05-29 LAB — T4, FREE: Free T4: 2 ng/dL — ABNORMAL HIGH (ref 0.60–1.60)

## 2023-05-29 LAB — VITAMIN B12: Vitamin B-12: 302 pg/mL (ref 211–911)

## 2023-05-29 LAB — VITAMIN D 25 HYDROXY (VIT D DEFICIENCY, FRACTURES): VITD: 24.98 ng/mL — ABNORMAL LOW (ref 30.00–100.00)

## 2023-05-29 MED ORDER — LEVOTHYROXINE SODIUM 175 MCG PO TABS
175.0000 ug | ORAL_TABLET | Freq: Every day | ORAL | 3 refills | Status: DC
Start: 2023-05-29 — End: 2024-04-08

## 2023-05-29 NOTE — Assessment & Plan Note (Signed)
Checking TSH and free T4 and adjust synthroid 175 mcg daily as needed.

## 2023-05-29 NOTE — Patient Instructions (Addendum)
We will check the labs schedule your mammogram on mychart.

## 2023-05-29 NOTE — Assessment & Plan Note (Signed)
Flu shot given. Pneumonia complete. Shingrix due at pharmacy. Tetanus due at pharmacy. Colonoscopy due 2033. Mammogram due she will schedule, pap smear aged out and dexa complete. Counseled about sun safety and mole surveillance. Counseled about the dangers of distracted driving. Given 10 year screening recommendations.

## 2023-05-29 NOTE — Progress Notes (Signed)
Subjective:   Patient ID: Megan Romero, female    DOB: [birth year], 72 y.o.   MRN: [id number]  HPI Here for medicare wellness and physical, no new complaints. Please see A/P for status and treatment of chronic medical problems.   Diet: heart healthy Physical activity: active Depression/mood screen: negative Hearing: intact to whispered voice Visual acuity: grossly normal, performs annual eye exam  ADLs: capable Fall risk: none Home safety: good Cognitive evaluation: intact to orientation, naming, recall and repetition EOL planning: adv directives discussed  Flowsheet Row Office Visit from 05/29/2023 in [facility] [place]  PHQ-2 Total Score 0       Flowsheet Row Office Visit from 05/29/2023 in [facility] [place]  PHQ-9 Total Score 0         11/28/2020    8:41 AM 11/28/2020    8:49 AM 06/04/2021   12:40 PM 03/26/2022    8:22 AM 05/29/2023    8:36 AM  Fall Risk  Falls in the past year? 0 0  0 0  Was there an injury with Fall? 0   0 0  Fall Risk Category Calculator 0   0 0  Fall Risk Category (Retired) Low   Low   (RETIRED) Patient Fall Risk Level   Low fall risk    Fall risk Follow up     Falls evaluation completed    I have personally reviewed and have noted 1. The patient's medical and social history - reviewed today no changes 2. Their use of alcohol, tobacco or illicit drugs 3. Their current medications and supplements 4. The patient's functional ability including ADL's, fall risks, home safety risks and hearing or visual impairment. 5. Diet and physical activities 6. Evidence for depression or mood disorders 7. Care team reviewed and updated 8.  The patient is not on an opioid pain medication   Patient Care Team: Myrlene Broker, MD as PCP - General (Internal Medicine) Past Medical History:  Diagnosis Date   B12 deficiency 04/28/2019   Degenerative disc disease, lumbar 02/08/2020   Depression     Hypothyroidism 05/26/2008   Loss of transverse plantar arch of right foot 10/01/2016   Thyroid disease    Hypothyroidism   Past Surgical History:  Procedure Laterality Date   ABDOMINAL HYSTERECTOMY     CARDIAC ELECTROPHYSIOLOGY MAPPING AND ABLATION     CARPAL TUNNEL RELEASE     b/l   Family History  Problem Relation Age of Onset   Heart disease Mother        mvp   Alzheimer's disease Father    Review of Systems  Constitutional: Negative.   HENT: Negative.    Eyes: Negative.   Respiratory:  Negative for cough, chest tightness and shortness of breath.   Cardiovascular:  Negative for chest pain, palpitations and leg swelling.  Gastrointestinal:  Negative for abdominal distention, abdominal pain, constipation, diarrhea, nausea and vomiting.  Musculoskeletal: Negative.   Skin: Negative.   Neurological: Negative.   Psychiatric/Behavioral: Negative.      Objective:  Physical Exam Constitutional:      Appearance: She is well-developed.  HENT:     Head: Normocephalic and atraumatic.  Cardiovascular:     Rate and Rhythm: Normal rate and regular rhythm.  Pulmonary:     Effort: Pulmonary effort is normal. No respiratory distress.     Breath sounds: Normal breath sounds. No wheezing or rales.  Abdominal:     General: Bowel sounds  are normal. There is no distension.     Palpations: Abdomen is soft.     Tenderness: There is no abdominal tenderness. There is no rebound.  Musculoskeletal:     Cervical back: Normal range of motion.  Skin:    General: Skin is warm and dry.  Neurological:     Mental Status: She is alert and oriented to person, place, and time.     Coordination: Coordination normal.     Vitals:   05/29/23 0837  BP: 116/80  Pulse: 86  Temp: 98.5 F (36.9 C)  TempSrc: Oral  SpO2: 92%  Weight: 166 lb (75.3 kg)  Height: 5\' 5"  (1.651 m)    Assessment & Plan:  Flu shot given at visit

## 2023-05-30 ENCOUNTER — Encounter: Payer: Self-pay | Admitting: Internal Medicine

## 2023-07-21 NOTE — Progress Notes (Unsigned)
[facility] [address] [place] [address] Phone: [phone] Subjective:   Megan Romero, am serving as a scribe for Dr. Antoine Romero.  I'm seeing this patient by the request  of:  Myrlene Broker, MD  CC: Low back pain follow-up  [id number]  05/13/2023 Still feel that this could be contributing to some of the discomfort and pain as well.  Discussed with patient about icing regimen and home exercises.  Discussed which activities to do and which ones to avoid.  Discussed potentially repeating the injection but patient states that she did have significant anxiety and would like to avoid another injection if possible.  Increase strengthening exercises and activity where patient can progress.  Follow-up with me again in 6 to 8 weeks otherwise.     Patient strength is better at the moment.  Sitting more comfortably as well on exam today.  I am optimistic that patient will be potentially continuing to improve.  We did discuss that the back could be also playing a potential role.  Discussed which activities to do and which ones to avoid.  Increase activity slowly otherwise.  Follow-up with me again 2 to 3 months to further evaluate.     Update 07/22/2023 Megan Romero is a 72 y.o. female coming in with complaint of lumbar spine and L glute pain. Patient states still hurts. Just checking. Patient states having anything that we have done has always been short-lived.  Nothing that has gotten rid of all her pain.  And is coming to the realization that she feels that this will be a long-term problem.     Past Medical History:  Diagnosis Date   B12 deficiency 04/28/2019   Degenerative disc disease, lumbar 02/08/2020   Depression    Hypothyroidism 05/26/2008   Loss of transverse plantar arch of right foot 10/01/2016   Thyroid disease    Hypothyroidism   Past Surgical History:  Procedure Laterality Date   ABDOMINAL HYSTERECTOMY     CARDIAC  ELECTROPHYSIOLOGY MAPPING AND ABLATION     CARPAL TUNNEL RELEASE     b/l   Social History   Socioeconomic History   Marital status: Widowed    Spouse name: Not on file   Number of children: Not on file   Years of education: Not on file   Highest education level: Associate degree: academic program  Occupational History   Not on file  Tobacco Use   Smoking status: Former    Current packs/day: 0.00    Types: Cigarettes    Quit date: 02/11/2017    Years since quitting: 6.4   Smokeless tobacco: Never  Vaping Use   Vaping status: Never Used  Substance and Sexual Activity   Alcohol use: Yes   Drug use: No   Sexual activity: Not Currently    Partners: Male  Other Topics Concern   Not on file  Social History Narrative   Not on file   Social Determinants of Health   Financial Resource Strain: Low Risk  (04/28/2023)   Overall Financial Resource Strain (CARDIA)    Difficulty of Paying Living Expenses: Not hard at all  Food Insecurity: No Food Insecurity (04/28/2023)   Hunger Vital Sign    Worried About Running Out of Food in the Last Year: Never true    Ran Out of Food in the Last Year: Never true  Transportation Needs: No Transportation Needs (04/28/2023)   PRAPARE - Transportation    Lack of  Transportation (Medical): No    Lack of Transportation (Non-Medical): No  Physical Activity: Sufficiently Active (04/28/2023)   Exercise Vital Sign    Days of Exercise per Week: 7 days    Minutes of Exercise per Session: 40 min  Stress: No Stress Concern Present (04/28/2023)   [employer] of Occupational Health - Occupational Stress Questionnaire    Feeling of Stress : Not at all  Social Connections: Moderately Integrated (04/28/2023)   Social Connection and Isolation Panel [NHANES]    Frequency of Communication with Friends and Family: Once a week    Frequency of Social Gatherings with Friends and Family: More than three times a week    Attends Religious Services: More than 4 times  per year    Active Member of [employer] or Organizations: Yes    Attends Banker Meetings: More than 4 times per year    Marital Status: Widowed   Allergies  Allergen Reactions   Penicillins    Family History  Problem Relation Age of Onset   Heart disease Mother        mvp   Alzheimer's disease Father     Current Outpatient Medications (Endocrine & Metabolic):    levothyroxine (SYNTHROID) 175 MCG tablet, Take 1 tablet (175 mcg total) by mouth daily.    Current Outpatient Medications (Analgesics):    aspirin 81 MG tablet, Take 81 mg by mouth daily.     Reviewed prior external information including notes and imaging from  primary care provider As well as notes that were available from care everywhere and other healthcare systems.  Past medical history, social, surgical and family history all reviewed in electronic medical record.  No pertanent information unless stated regarding to the chief complaint.   Review of Systems:  No headache, visual changes, nausea, vomiting, diarrhea, constipation, dizziness, abdominal pain, skin rash, fevers, chills, night sweats, weight loss, swollen lymph nodes, body aches, joint swelling, chest pain, shortness of breath, mood changes. POSITIVE muscle aches  Objective  Blood pressure 118/74, pulse 83, height 5\' 5"  (1.651 m), weight 162 lb (73.5 kg), SpO2 97%.   General: No apparent distress alert and oriented x3 mood and affect normal, dressed appropriately.  HEENT: Pupils equal, extraocular movements intact  Respiratory: Patient's speak in full sentences and does not appear short of breath  Cardiovascular: No lower extremity edema, non tender, no erythema  Low back does have some loss lordosis.  Still severely tender to palpation more over the lateral aspect of the left hip.  This does have though tightness noted with Pearlean Brownie and straight leg test.  Patient does have a mild antalgic gait noted. Worsening pain with extension and flexion  of the back noted today.   Impression and Recommendations:    The above documentation has been reviewed and is accurate and complete Judi Saa, DO

## 2023-07-22 ENCOUNTER — Ambulatory Visit: Payer: PPO | Admitting: Family Medicine

## 2023-07-22 ENCOUNTER — Encounter: Payer: Self-pay | Admitting: Family Medicine

## 2023-07-22 VITALS — BP 118/74 | HR 83 | Ht 65.0 in | Wt 162.0 lb

## 2023-07-22 DIAGNOSIS — M51362 Other intervertebral disc degeneration, lumbar region with discogenic back pain and lower extremity pain: Secondary | ICD-10-CM | POA: Diagnosis not present

## 2023-07-22 DIAGNOSIS — M5416 Radiculopathy, lumbar region: Secondary | ICD-10-CM | POA: Diagnosis not present

## 2023-07-22 NOTE — Assessment & Plan Note (Signed)
Patient did have some improvement with the degenerative disc pain with the epidural.  Discussed with patient about which activities to do and which ones to avoid.  Increase activity slowly over the course of next several weeks.  Patient will follow-up with me 6 weeks after the injection.  Discussed with patient and reviewing previous notes 31 minutes

## 2023-07-22 NOTE — Patient Instructions (Signed)
Sorry for running late today  Ice 20 minutes 2 times daily. Usually after activity and before bed. We will do another epidural  They should call you soon  See me then 8 weeks after the injection and we will discuss  Happy holidays!

## 2023-08-04 DIAGNOSIS — H00021 Hordeolum internum right upper eyelid: Secondary | ICD-10-CM | POA: Diagnosis not present

## 2023-08-04 DIAGNOSIS — H04123 Dry eye syndrome of bilateral lacrimal glands: Secondary | ICD-10-CM | POA: Diagnosis not present

## 2023-08-04 DIAGNOSIS — H43813 Vitreous degeneration, bilateral: Secondary | ICD-10-CM | POA: Diagnosis not present

## 2023-08-04 DIAGNOSIS — H33321 Round hole, right eye: Secondary | ICD-10-CM | POA: Diagnosis not present

## 2023-08-04 DIAGNOSIS — H01029 Squamous blepharitis unspecified eye, unspecified eyelid: Secondary | ICD-10-CM | POA: Diagnosis not present

## 2023-08-04 DIAGNOSIS — H524 Presbyopia: Secondary | ICD-10-CM | POA: Diagnosis not present

## 2023-08-04 DIAGNOSIS — H52223 Regular astigmatism, bilateral: Secondary | ICD-10-CM | POA: Diagnosis not present

## 2023-08-04 DIAGNOSIS — H5213 Myopia, bilateral: Secondary | ICD-10-CM | POA: Diagnosis not present

## 2023-08-04 DIAGNOSIS — H2513 Age-related nuclear cataract, bilateral: Secondary | ICD-10-CM | POA: Diagnosis not present

## 2023-08-04 DIAGNOSIS — H5231 Anisometropia: Secondary | ICD-10-CM | POA: Diagnosis not present

## 2023-08-04 DIAGNOSIS — H18523 Epithelial (juvenile) corneal dystrophy, bilateral: Secondary | ICD-10-CM | POA: Diagnosis not present

## 2023-10-16 DIAGNOSIS — Z411 Encounter for cosmetic surgery: Secondary | ICD-10-CM | POA: Diagnosis not present

## 2023-10-16 DIAGNOSIS — L570 Actinic keratosis: Secondary | ICD-10-CM | POA: Diagnosis not present

## 2024-01-06 NOTE — Progress Notes (Addendum)
 [facility] [address] [place] [address] Phone: [phone] Subjective:   Megan Romero, am serving as a scribe for Dr. Ronnell Coins.  I'm seeing this patient by the request  of:  Adelia Homestead, MD  CC: Left shoulder pain follow up   [id number]  Megan Romero is a 73 y.o. female coming in with complaint of L shoulder pain for the past few months. Patient last seen in November 2024. Patient states that she now has pain in L shoulder. Pain on top of shoulder especially with abduction and flexion.       Past Medical History:  Diagnosis Date   B12 deficiency 04/28/2019   Degenerative disc disease, lumbar 02/08/2020   Depression    Hypothyroidism 05/26/2008   Loss of transverse plantar arch of right foot 10/01/2016   Thyroid  disease    Hypothyroidism   Past Surgical History:  Procedure Laterality Date   ABDOMINAL HYSTERECTOMY     CARDIAC ELECTROPHYSIOLOGY MAPPING AND ABLATION     CARPAL TUNNEL RELEASE     b/l   Social History   Socioeconomic History   Marital status: Widowed    Spouse name: Not on file   Number of children: Not on file   Years of education: Not on file   Highest education level: Associate degree: academic program  Occupational History   Not on file  Tobacco Use   Smoking status: Former    Current packs/day: 0.00    Types: Cigarettes    Quit date: 02/11/2017    Years since quitting: 6.9   Smokeless tobacco: Never  Vaping Use   Vaping status: Never Used  Substance and Sexual Activity   Alcohol use: Yes   Drug use: No   Sexual activity: Not Currently    Partners: Male  Other Topics Concern   Not on file  Social History Narrative   Not on file   Social Drivers of Health   Financial Resource Strain: Low Risk  (04/28/2023)   Overall Financial Resource Strain (CARDIA)    Difficulty of Paying Living Expenses: Not hard at all  Food Insecurity: No Food Insecurity (04/28/2023)   Hunger Vital Sign     Worried About Running Out of Food in the Last Year: Never true    Ran Out of Food in the Last Year: Never true  Transportation Needs: No Transportation Needs (04/28/2023)   PRAPARE - Administrator, Civil Service (Medical): No    Lack of Transportation (Non-Medical): No  Physical Activity: Sufficiently Active (04/28/2023)   Exercise Vital Sign    Days of Exercise per Week: 7 days    Minutes of Exercise per Session: 40 min  Stress: No Stress Concern Present (04/28/2023)   [employer] of Occupational Health - Occupational Stress Questionnaire    Feeling of Stress : Not at all  Social Connections: Moderately Integrated (04/28/2023)   Social Connection and Isolation Panel [NHANES]    Frequency of Communication with Friends and Family: Once a week    Frequency of Social Gatherings with Friends and Family: More than three times a week    Attends Religious Services: More than 4 times per year    Active Member of [employer] or Organizations: Yes    Attends Banker Meetings: More than 4 times per year    Marital Status: Widowed   Allergies  Allergen Reactions   Penicillins    Family History  Problem Relation Age of Onset  Heart disease Mother        mvp   Alzheimer's disease Father     Current Outpatient Medications (Endocrine & Metabolic):    levothyroxine  (SYNTHROID ) 175 MCG tablet, Take 1 tablet (175 mcg total) by mouth daily.    Current Outpatient Medications (Analgesics):    aspirin 81 MG tablet, Take 81 mg by mouth daily.     Reviewed prior external information including notes and imaging from  primary care provider As well as notes that were available from care everywhere and other healthcare systems.  Past medical history, social, surgical and family history all reviewed in electronic medical record.  No pertanent information unless stated regarding to the chief complaint.   Review of Systems:  No headache, visual changes, nausea, vomiting,  diarrhea, constipation, dizziness, abdominal pain, skin rash, fevers, chills, night sweats, weight loss, swollen lymph nodes, body aches, joint swelling, chest pain, shortness of breath, mood changes. POSITIVE muscle aches  Objective  Blood pressure 120/82, pulse 66, height 5\' 5"  (1.651 m), weight 153 lb (69.4 kg), SpO2 99%.   General: No apparent distress alert and oriented x3 mood and affect normal, dressed appropriately.  HEENT: Pupils equal, extraocular movements intact  Respiratory: Patient's speak in full sentences and does not appear short of breath  Cardiovascular: No lower extremity edema, non tender, no erythema  Left shoulder exam shows positive impingement noted.  Does have positive crossover noted as well.  Fairly severe overall.  Limited muscular skeletal ultrasound was performed and interpreted by Ronnell Coins, M  Limited ultrasound shows a patient does have some breakdown of the tendon noted.  Some intersubstance tearing noted.  5 degrees of retraction.  Moderate to severe arthritic changes noted of the acromioclavicular joint. Impression: Rotator cuff arthropathy and acromioclavicular arthropathy  Procedure: Real-time Ultrasound Guided Injection of left glenohumeral joint Device: GE Logiq E  Ultrasound guided injection is preferred based studies that show increased duration, increased effect, greater accuracy, decreased procedural pain, increased response rate with ultrasound guided versus blind injection.  Verbal informed consent obtained.  Time-out conducted.  Noted no overlying erythema, induration, or other signs of local infection.  Skin prepped in a sterile fashion.  Local anesthesia: Topical Ethyl chloride.  With sterile technique and under real time ultrasound guidance:  Joint visualized.  21g 2 inch needle inserted posterior approach. Pictures taken for needle placement. Patient did have injection of 2 cc of 0.5% Marcaine, and 1cc of Kenalog 40 mg/dL. Completed  without difficulty  Pain immediately resolved suggesting accurate placement of the medication.  Advised to call if fevers/chills, erythema, induration, drainage, or persistent bleeding.  Images permanently stored  Impression: Technically successful ultrasound guided injection.  Procedure: Real-time Ultrasound Guided Injection of left acromioclavicular joint Device: GE Logiq Q7 Ultrasound guided injection is preferred based studies that show increased duration, increased effect, greater accuracy, decreased procedural pain, increased response rate, and decreased cost with ultrasound guided versus blind injection.  Verbal informed consent obtained.  Time-out conducted.  Noted no overlying erythema, induration, or other signs of local infection.  Skin prepped in a sterile fashion.  Local anesthesia: Topical Ethyl chloride.  With sterile technique and under real time ultrasound guidance: With a 25-gauge half inch needle injected with 0.5 cc of 0.5% Marcaine and 0.5 cc of Kenalog 40 mg/mL Completed without difficulty  Pain immediately resolved suggesting accurate placement of the medication.  Advised to call if fevers/chills, erythema, induration, drainage, or persistent bleeding.  Impression: Technically successful ultrasound guided injection.  Impression and Recommendations:     The above documentation has been reviewed and is accurate and complete Shaneka Efaw M Laquincy Eastridge, DO

## 2024-01-08 ENCOUNTER — Encounter: Payer: Self-pay | Admitting: Family Medicine

## 2024-01-08 ENCOUNTER — Ambulatory Visit (INDEPENDENT_AMBULATORY_CARE_PROVIDER_SITE_OTHER)

## 2024-01-08 ENCOUNTER — Ambulatory Visit: Admitting: Family Medicine

## 2024-01-08 ENCOUNTER — Other Ambulatory Visit: Payer: Self-pay

## 2024-01-08 VITALS — BP 120/82 | HR 66 | Ht 65.0 in | Wt 153.0 lb

## 2024-01-08 DIAGNOSIS — M19012 Primary osteoarthritis, left shoulder: Secondary | ICD-10-CM | POA: Diagnosis not present

## 2024-01-08 DIAGNOSIS — M75102 Unspecified rotator cuff tear or rupture of left shoulder, not specified as traumatic: Secondary | ICD-10-CM | POA: Diagnosis not present

## 2024-01-08 DIAGNOSIS — M25512 Pain in left shoulder: Secondary | ICD-10-CM

## 2024-01-08 DIAGNOSIS — M25511 Pain in right shoulder: Secondary | ICD-10-CM | POA: Diagnosis not present

## 2024-01-08 DIAGNOSIS — M12812 Other specific arthropathies, not elsewhere classified, left shoulder: Secondary | ICD-10-CM

## 2024-01-08 NOTE — Assessment & Plan Note (Signed)
 Patient given injection and tolerated the procedure well, discussed icing regimen home exercise, discussed with patient about limiting certain range of motion.  Increase activity slowly.  Follow-up again in 6 to 8 weeks

## 2024-01-08 NOTE — Patient Instructions (Addendum)
 Xray today Injection in shoulder Do prescribed exercises at least 3x a week  See you again in 10-12 weeks

## 2024-01-09 ENCOUNTER — Encounter: Payer: Self-pay | Admitting: Family Medicine

## 2024-01-15 DIAGNOSIS — M12812 Other specific arthropathies, not elsewhere classified, left shoulder: Secondary | ICD-10-CM | POA: Insufficient documentation

## 2024-01-15 NOTE — Assessment & Plan Note (Signed)
 Chronic problem with exacerbation.  Does have rotator cuff arthropathy noted.  Discussed icing regimen and home exercises, discussed which activities to be 1 which ones to avoid.  Increase activity slowly.  Discussed icing regimen.  Follow-up again in 6 to 8 weeks otherwise.

## 2024-01-20 ENCOUNTER — Ambulatory Visit: Admitting: Family Medicine

## 2024-02-13 DIAGNOSIS — H04123 Dry eye syndrome of bilateral lacrimal glands: Secondary | ICD-10-CM | POA: Diagnosis not present

## 2024-02-13 DIAGNOSIS — H5231 Anisometropia: Secondary | ICD-10-CM | POA: Diagnosis not present

## 2024-02-13 DIAGNOSIS — H5213 Myopia, bilateral: Secondary | ICD-10-CM | POA: Diagnosis not present

## 2024-02-13 DIAGNOSIS — H524 Presbyopia: Secondary | ICD-10-CM | POA: Diagnosis not present

## 2024-02-13 DIAGNOSIS — H43813 Vitreous degeneration, bilateral: Secondary | ICD-10-CM | POA: Diagnosis not present

## 2024-02-13 DIAGNOSIS — H33321 Round hole, right eye: Secondary | ICD-10-CM | POA: Diagnosis not present

## 2024-02-13 DIAGNOSIS — H2513 Age-related nuclear cataract, bilateral: Secondary | ICD-10-CM | POA: Diagnosis not present

## 2024-02-13 DIAGNOSIS — H01029 Squamous blepharitis unspecified eye, unspecified eyelid: Secondary | ICD-10-CM | POA: Diagnosis not present

## 2024-02-13 DIAGNOSIS — H52223 Regular astigmatism, bilateral: Secondary | ICD-10-CM | POA: Diagnosis not present

## 2024-02-13 DIAGNOSIS — H18523 Epithelial (juvenile) corneal dystrophy, bilateral: Secondary | ICD-10-CM | POA: Diagnosis not present

## 2024-03-18 ENCOUNTER — Ambulatory Visit: Admitting: Family Medicine

## 2024-04-08 ENCOUNTER — Other Ambulatory Visit: Payer: Self-pay | Admitting: Internal Medicine

## 2024-04-08 DIAGNOSIS — E039 Hypothyroidism, unspecified: Secondary | ICD-10-CM

## 2024-04-27 ENCOUNTER — Ambulatory Visit: Admitting: Internal Medicine

## 2024-10-12 ENCOUNTER — Encounter: Admitting: Internal Medicine
